# Patient Record
Sex: Male | Born: 1995
Health system: Southern US, Community
[De-identification: ages and names within clinical notes are randomized; demographics above are authoritative.]

## PROBLEM LIST (undated history)

## (undated) DIAGNOSIS — F209 Schizophrenia, unspecified: Secondary | ICD-10-CM

---

## 2019-05-01 ENCOUNTER — Emergency Department (HOSPITAL_COMMUNITY): Payer: Self-pay

## 2019-05-01 ENCOUNTER — Emergency Department (HOSPITAL_COMMUNITY)
Admission: EM | Admit: 2019-05-01 | Discharge: 2019-05-01 | Disposition: A | Discharge status: Home / Self Care | Payer: Self-pay | Attending: Emergency Medicine | Admitting: Emergency Medicine

## 2019-05-01 ENCOUNTER — Encounter (HOSPITAL_COMMUNITY): Payer: Self-pay | Admitting: Emergency Medicine

## 2019-05-01 DIAGNOSIS — R569 Unspecified convulsions: Secondary | ICD-10-CM | POA: Insufficient documentation

## 2019-05-01 DIAGNOSIS — Z79899 Other long term (current) drug therapy: Secondary | ICD-10-CM | POA: Insufficient documentation

## 2019-05-01 LAB — CBC
HCT: 43.3 % (ref 39.0–52.0)
Hemoglobin: 14.7 g/dL (ref 13.0–17.0)
MCH: 29 pg (ref 26.0–34.0)
MCHC: 33.9 g/dL (ref 30.0–36.0)
MCV: 85.4 fL (ref 80.0–100.0)
Platelets: 230 10*3/uL (ref 150–400)
RBC: 5.07 MIL/uL (ref 4.22–5.81)
RDW: 13.4 % (ref 11.5–15.5)
WBC: 13.5 10*3/uL — ABNORMAL HIGH (ref 4.0–10.5)
nRBC: 0 % (ref 0.0–0.2)

## 2019-05-01 LAB — LITHIUM LEVEL: Lithium Lvl: 0.49 mmol/L — ABNORMAL LOW (ref 0.60–1.20)

## 2019-05-01 LAB — ETHANOL: Alcohol, Ethyl (B): <10 mg/dL (ref <10)

## 2019-05-01 LAB — CBG MONITORING, ED: Glucose-Capillary: 94 mg/dL (ref 70–99)

## 2019-05-01 LAB — HEPATIC FUNCTION PANEL
ALT: 31 U/L (ref 0–44)
AST: 34 U/L (ref 15–41)
Albumin: 4.5 g/dL (ref 3.5–5.0)
Alkaline Phosphatase: 69 U/L (ref 38–126)
Bilirubin, Direct: 0.1 mg/dL (ref 0.0–0.2)
Indirect Bilirubin: 0.8 mg/dL (ref 0.3–0.9)
Total Bilirubin: 0.9 mg/dL (ref 0.3–1.2)
Total Protein: 8.1 g/dL (ref 6.5–8.1)

## 2019-05-01 LAB — BASIC METABOLIC PANEL
Anion gap: 13 (ref 5–15)
BUN: 10 mg/dL (ref 6–20)
CO2: 22 mmol/L (ref 22–32)
Calcium: 9.6 mg/dL (ref 8.9–10.3)
Chloride: 103 mmol/L (ref 98–111)
Creatinine, Ser: 1.15 mg/dL (ref 0.61–1.24)
GFR calc Af Amer: >60 mL/min (ref >60)
GFR calc non Af Amer: >60 mL/min (ref >60)
Glucose, Bld: 110 mg/dL — ABNORMAL HIGH (ref 70–99)
Potassium: 3.9 mmol/L (ref 3.5–5.1)
Sodium: 138 mmol/L (ref 135–145)

## 2019-05-01 LAB — SALICYLATE LEVEL: Salicylate Lvl: <7 mg/dL (ref 2.8–30.0)

## 2019-05-01 LAB — RAPID URINE DRUG SCREEN, HOSP PERFORMED
Amphetamines: NOT DETECTED
Barbiturates: NOT DETECTED
Benzodiazepines: NOT DETECTED
Cocaine: NOT DETECTED
Opiates: NOT DETECTED
Tetrahydrocannabinol: NOT DETECTED

## 2019-05-01 LAB — ACETAMINOPHEN LEVEL: Acetaminophen (Tylenol), Serum: <10 ug/mL — ABNORMAL LOW (ref 10–30)

## 2019-05-01 MED ORDER — SODIUM CHLORIDE 0.9 % IV BOLUS
1000.0000 mL | Freq: Once | INTRAVENOUS | Status: AC
  Administered 2019-05-01: 1000 mL via INTRAVENOUS

## 2019-05-01 NOTE — ED Provider Notes (Signed)
[facility] Provider Note   CSN: [id number] Arrival date & time: 05/01/19  1524     History   Chief Complaint Chief Complaint  Patient presents with   Seizures    HPI Stephen Davis is a 23 y.o. male with past medical history of bipolar disorder, denting to the emergency department via EMS with seizure.  Patient states he was at a friend's house when this occurred.  Per EMS, on arrival he appeared to be postictal, with tongue biting and possible urinary incontinence.  They were unsure if he had urinated on himself or had spilled beer that he was holding at the time.  He endorses recent alcohol use, either last night or this morning, however history is somewhat limited due to patient's confusion.  He states he drinks alcohol diet daily or every other day.  He thinks he may have had about 5 beers.  He states he was recently newly prescribed lithium about a week ago and has been taking as prescribed.  He also takes hydroxyzine at night for sleep.  No previous seizure history.  No history of alcohol withdrawal seizure. EMS personnel reports concern for overdose, however by calculating amount of tabs of lithium and hydroxyzine remaining in the bottle comparing to date of film and prescribed dose, patient has the appropriate amount of tablets missing for proper dosing.     The history is provided by the patient and the EMS personnel.    History reviewed. No pertinent past medical history.  There are no active problems to display for this patient.   History reviewed. No pertinent surgical history.      Home Medications    Prior to Admission medications   Medication Sig Start Date End Date Taking? Authorizing Provider  hydrOXYzine (VISTARIL) 25 MG capsule Take 25 mg by mouth every 4 (four) hours as needed for anxiety.    Yes [provider]  lithium carbonate (LITHOBID) 300 MG CR tablet Take 300 mg by mouth 2 (two) times daily.   Yes  [provider]    Family History No family history on file.  Social History Social History   Tobacco Use   Smoking status: Not on file  Substance Use Topics   Alcohol use: Not on file   Drug use: Not on file     Allergies   Patient has no known allergies.   Review of Systems Review of Systems  Unable to perform ROS: Mental status change  Constitutional: Negative for fever.  Neurological: Positive for seizures. Negative for headaches.  Psychiatric/Behavioral: Positive for confusion.     Physical Exam Updated Vital Signs BP 120/77    Pulse (!) 107    Temp 98.2 F (36.8 C) (Oral)    Resp 20    SpO2 99%   Physical Exam Vitals signs and nursing note reviewed.  Constitutional:      General: He is not in acute distress.    Appearance: He is well-developed.  HENT:     Head: Normocephalic and atraumatic.     Mouth/Throat:     Comments: Small laceration to the lateral aspect of right tongue.  Not actively bleeding. Eyes:     Conjunctiva/sclera: Conjunctivae normal.  Cardiovascular:     Rate and Rhythm: Regular rhythm. Tachycardia present.  Pulmonary:     Effort: Pulmonary effort is normal. No respiratory distress.     Breath sounds: Normal breath sounds.  Abdominal:     General: Bowel sounds are normal.  Palpations: Abdomen is soft.     Tenderness: There is no abdominal tenderness. There is no guarding or rebound.  Skin:    General: Skin is warm.  Neurological:     Mental Status: He is alert.     Comments: Patient is alert though appears drowsy and is confused with questioning regarding HPI and ROS.  Speech is slurred. PERRL, EOM normal.  Cranial nerves grossly intact.  5/5 grip strength bilateral upper extremities.  Psychiatric:        Behavior: Behavior normal.      ED Treatments / Results  Labs (all labs ordered are listed, but only abnormal results are displayed) Labs Reviewed  BASIC METABOLIC PANEL - Abnormal; Notable for the following  components:      Result Value   Glucose, Bld 110 (*)    All other components within normal limits  LITHIUM LEVEL - Abnormal; Notable for the following components:   Lithium Lvl 0.49 (*)    All other components within normal limits  ACETAMINOPHEN LEVEL - Abnormal; Notable for the following components:   Acetaminophen (Tylenol), Serum <10 (*)    All other components within normal limits  CBC - Abnormal; Notable for the following components:   WBC 13.5 (*)    All other components within normal limits  HEPATIC FUNCTION PANEL  RAPID URINE DRUG SCREEN, HOSP PERFORMED  SALICYLATE LEVEL  ETHANOL  CBC  CBG MONITORING, ED  CBG MONITORING, ED    EKG EKG Interpretation  Date/Time:  Wednesday May 01 2019 15:28:39 EST Ventricular Rate:  104 PR Interval:    QRS Duration: 96 QT Interval:  363 QTC Calculation: 478 R Axis:   66 Text Interpretation: Sinus tachycardia Borderline prolonged QT interval Confirmed by Virgina Norfolk [phone]) on 05/01/2019 4:03:00 PM   Radiology Ct Head Wo Contrast  Result Date: 05/01/2019 CLINICAL DATA:  Seizure, new, nontraumatic, 18-40 years. EXAM: CT HEAD WITHOUT CONTRAST TECHNIQUE: Contiguous axial images were obtained from the base of the skull through the vertex without intravenous contrast. COMPARISON:  No pertinent prior studies available for comparison. FINDINGS: Brain: No evidence of acute intracranial hemorrhage. No demarcated cortical infarction. No evidence of intracranial mass. No midline shift or extra-axial fluid collection. Cerebral volume is normal for age. Vascular: No hyperdense vessel. Skull: Normal. Negative for fracture or focal lesion. Sinuses/Orbits: Visualized orbits demonstrate no acute abnormality. Small mucous retention cysts within the bilateral maxillary sinuses. IMPRESSION: No CT evidence of acute intracranial abnormality. Given provided history of new seizure, consider brain MRI with seizure protocol for further evaluation.  Electronically Signed   By: Jackey Loge DO   On: 05/01/2019 17:43    Procedures Procedures (including critical care time)  Medications Ordered in ED Medications  sodium chloride 0.9 % bolus 1,000 mL (1,000 mLs Intravenous New Bag/Given 05/01/19 1824)     Initial Impression / Assessment and Plan / ED Course  I have reviewed the triage vital signs and the nursing notes.  Pertinent labs & imaging results that were available during my care of the patient were reviewed by me and considered in my medical decision making (see chart for details).  Clinical Course as of Apr 30 1945  Wed May 01, 2019  1540 On reevaluation, patient appears to be at his baseline.  He states he is feeling much better.  History is more clear at this time.  He states he had  two 20 ounce beers earlier this morning.  He endorses that this is the first prescription of  lithium he has received.  He was taking as prescribed.  His mother is at bedside now, reports she had seizures from preeclampsia, however never had recurrence.  No other family seizure disorder known.  His lab work is reassuring, discussed this with patient.  Pending CBC is initial specimen clotted.  CT head is negative.  IV fluids finished.  Patient ambulatory in the ED without difficulty.  Discussed outpatient seizure precautions and importance for close outpatient neurology follow-up.  Patient is agreeable to plan and without complaint at this time.   [JR]    Clinical Course User Index [JR] Fahim Kats, [place] N, PA-C       Patient presenting with likely seizure that occurred prior to arrival.  Patient was postictal on scene with tongue biting had occurred.  Patient recently started on lithium at 1.5 weeks ago for bipolar disorder.  This is his first prescription of this medication, and he has been taking as prescribed.  He also endorses alcohol use this morning.  Suspect seizure episode was due to lowered seizure threshold due to combination of medications  and alcohol.  Patient is somewhat confused on initial evaluation, however after observation patient returned to baseline and is ambulatory without difficulty.  He is in no distress.  Lab work is very reassuring.  Head CT is negative.  No family history of seizure disorder, with the exception of his mother having seizure due to preeclampsia without recurrence since.  Patient discussed with and evaluated by Dr. Ronnald Nian.  Will discharge at this time with ambulatory referral to neurology for further outpatient work-up.  He is instructing of driving restrictions and strict return precautions should seizure recur.  Pt is well-appearing, agreeable to plan, and safe for discharge.  Discussed results, findings, treatment and follow up. Patient advised of return precautions. Patient verbalized understanding and agreed with plan.   Final Clinical Impressions(s) / ED Diagnoses   Final diagnoses:  Seizure [facility])    ED Discharge Orders         Ordered    Ambulatory referral to Neurology    Comments: An appointment is requested in approximately: 1 week   05/01/19 1944           Teodora Baumgarten, [place] N, PA-C 05/01/19 [place], [place], DO 05/02/19 [phone]

## 2019-05-01 NOTE — ED Triage Notes (Signed)
Pt arrived via [facility] from home where family called EMS to report pt seizure. EMS found pt postictal on scene but was alert and oriented upon ED arrival. Family reported to EMS that pt had potentially overdosed on lithium and hydroxyzine. Medication counted by EMS and found to be missing 17 lithium and 16 hydroxyzine pills, based on prescription dates. Pt alert and oriented but slow to respond to some questions. Tongue trauma noted, no other injuries noted at this time.

## 2019-05-01 NOTE — Discharge Instructions (Signed)
Please stop taking the lithium and discuss with your prescribing provider. Do not drive or operate any heavy machinery until you have been cleared by the neurologist to do so.  Generally it is at least for 6 months. The neurology office should give you a call to schedule an appointment, however if you not hear from them in the next couple of days, please reach out for an appointment.   If you have recurrent episode of seizure, please return to the emergency department.

## 2019-05-01 NOTE — ED Provider Notes (Signed)
Medical screening examination/treatment/procedure(s) were conducted as a shared visit with non-physician practitioner(s) and myself.  I personally evaluated the patient during the encounter. Briefly, the patient is a 23 y.o. male presents the ED with seizure.  Patient with normal vitals.  No fever.  Patient recently started on lithium and Vistaril.  States that he drinks alcohol daily.  Last drink was maybe this morning last night.  Took a Xanax this morning as well.  Denies any suicidal homicidal ideation.  Had a seizure like episode prior to arrival.  EKG shows sinus tachycardia.  No ischemic changes.  No concern for arrhythmic changes.  QTC is 478.  Patient has cut to his tongue, postictal.  Likely did have a seizure.  Otherwise is neurologically intact.  Blood sugar is normal.  Counting out his medications it looks like he is appropriately taken his lithium.  There are 16 to 17 pills missing which she filled about 9 days ago.  Likely not related to lithium but will check a level.  Likely seizure due to polysubstance abuse and these new medications.  We will get screening labs, ethanol level.  CT head.  Will reevaluate.  Does not appear to be endorsing any suicidal ideation.  Does not appear that he intentionally took any of his medications inappropriately.  Overall it appears that he is appropriately following his medicine regimen but mixing it with alcohol and drugs is likely not ideal.  Lab work overall unremarkable.  Ethanol level normal.  Tylenol and salicylate level normal.  Lithium level below therapeutic level.  CT head unremarkable.  No significant anemia, electrolyte abnormality, kidney injury.  Will have patient follow-up with neurology for EEG and MRI.  Understands no driving or operating heavy machinery until he is cleared by neurology.   This chart was dictated using voice recognition software.  Despite best efforts to proofread,  errors can occur which can change the documentation meaning.      EKG Interpretation  Date/Time:  Wednesday May 01 2019 15:28:39 EST Ventricular Rate:  104 PR Interval:    QRS Duration: 96 QT Interval:  363 QTC Calculation: 478 R Axis:   66 Text Interpretation: Sinus tachycardia Borderline prolonged QT interval Confirmed by Lennice Sites [phone]) on 05/01/2019 4:03:00 PM           Lennice Sites, DO 05/01/19 1941

## 2020-06-21 ENCOUNTER — Inpatient Hospital Stay (HOSPITAL_COMMUNITY)
Admission: EM | Admit: 2020-06-21 | Discharge: 2020-07-03 | DRG: 917 | Disposition: A | Discharge status: Other Acute Inpatient Hospital | Payer: Medicaid Other | Attending: Internal Medicine | Admitting: Internal Medicine

## 2020-06-21 ENCOUNTER — Encounter (HOSPITAL_COMMUNITY): Payer: Self-pay | Admitting: Emergency Medicine

## 2020-06-21 ENCOUNTER — Other Ambulatory Visit: Payer: Self-pay

## 2020-06-21 DIAGNOSIS — F319 Bipolar disorder, unspecified: Secondary | ICD-10-CM | POA: Diagnosis present

## 2020-06-21 DIAGNOSIS — E876 Hypokalemia: Secondary | ICD-10-CM | POA: Diagnosis present

## 2020-06-21 DIAGNOSIS — T443X1A Poisoning by other parasympatholytics [anticholinergics and antimuscarinics] and spasmolytics, accidental (unintentional), initial encounter: Secondary | ICD-10-CM

## 2020-06-21 DIAGNOSIS — Z818 Family history of other mental and behavioral disorders: Secondary | ICD-10-CM

## 2020-06-21 DIAGNOSIS — F209 Schizophrenia, unspecified: Secondary | ICD-10-CM | POA: Diagnosis present

## 2020-06-21 DIAGNOSIS — G9341 Metabolic encephalopathy: Secondary | ICD-10-CM | POA: Diagnosis present

## 2020-06-21 DIAGNOSIS — T1491XA Suicide attempt, initial encounter: Secondary | ICD-10-CM

## 2020-06-21 DIAGNOSIS — Z781 Physical restraint status: Secondary | ICD-10-CM

## 2020-06-21 DIAGNOSIS — H5704 Mydriasis: Secondary | ICD-10-CM | POA: Diagnosis present

## 2020-06-21 DIAGNOSIS — T50902A Poisoning by unspecified drugs, medicaments and biological substances, intentional self-harm, initial encounter: Secondary | ICD-10-CM

## 2020-06-21 DIAGNOSIS — Y901 Blood alcohol level of 20-39 mg/100 ml: Secondary | ICD-10-CM | POA: Diagnosis present

## 2020-06-21 DIAGNOSIS — T50901A Poisoning by unspecified drugs, medicaments and biological substances, accidental (unintentional), initial encounter: Secondary | ICD-10-CM | POA: Diagnosis present

## 2020-06-21 DIAGNOSIS — T450X2A Poisoning by antiallergic and antiemetic drugs, intentional self-harm, initial encounter: Principal | ICD-10-CM | POA: Diagnosis present

## 2020-06-21 DIAGNOSIS — G928 Other toxic encephalopathy: Secondary | ICD-10-CM | POA: Diagnosis present

## 2020-06-21 DIAGNOSIS — Z20822 Contact with and (suspected) exposure to covid-19: Secondary | ICD-10-CM | POA: Diagnosis present

## 2020-06-21 DIAGNOSIS — Z9151 Personal history of suicidal behavior: Secondary | ICD-10-CM

## 2020-06-21 DIAGNOSIS — Z9114 Patient's other noncompliance with medication regimen: Secondary | ICD-10-CM

## 2020-06-21 DIAGNOSIS — F17210 Nicotine dependence, cigarettes, uncomplicated: Secondary | ICD-10-CM | POA: Diagnosis present

## 2020-06-21 HISTORY — DX: Schizophrenia, unspecified: F20.9

## 2020-06-21 LAB — CBG MONITORING, ED: Glucose-Capillary: 95 mg/dL (ref 70–99)

## 2020-06-21 MED ORDER — LACTATED RINGERS IV BOLUS
1000.0000 mL | Freq: Once | INTRAVENOUS | Status: AC
  Administered 2020-06-21: 1000 mL via INTRAVENOUS

## 2020-06-21 NOTE — ED Provider Notes (Incomplete)
I provided a substantive portion of the care of this patient.  I personally performed the entirety of the history, exam and medical decision making for this encounter.  Reported large benadryl dose. On exam has roving eye movements, dilated, tachycardic. Initially was decreased LOC but has improved since being here. Will not currently require intubation. Plan for fluid resuscitation and observation for medical clearance.    EKG Interpretation  Date/Time:  Sunday June 21 2020 23:15:52 EST Ventricular Rate:  143 PR Interval:    QRS Duration: 106 QT Interval:  287 QTC Calculation: 443 R Axis:   62 Text Interpretation: Sinus tachycardia Nonspecific repol abnormality, diffuse leads repol changes likely relatedc to rate, qt unremarkable, qrs appropriate Confirmed by Marily Memos [phone]) on 06/21/2020 11:48:38 PM       CRITICAL CARE Performed by: Marily Memos Total critical care time: 35 minutes Critical care time was exclusive of separately billable procedures and treating other patients. Critical care was necessary to treat or prevent imminent or life-threatening deterioration. Critical care was time spent personally by me on the following activities: development of treatment plan with patient and/or surrogate as well as nursing, discussions with consultants, evaluation of patient's response to treatment, examination of patient, obtaining history from patient or surrogate, ordering and performing treatments and interventions, ordering and review of laboratory studies, ordering and review of radiographic studies, pulse oximetry and re-evaluation of patient's condition.

## 2020-06-21 NOTE — ED Notes (Signed)
Danielle at [employer] recommends: Observe for a minimum of 6 hrs., longer if symptomatic. Activated charcoal if tolerated (1g/kg). Fluids for tachycardia. Benzos for agitation or seizure. Repeat EKG in 4 hours. Bicarb if QRS > 120. If QTC >500, optimize K+ and mag. 4 hour tylenol level and baseline lithium level recommended. Monitor urination.

## 2020-06-21 NOTE — ED Provider Notes (Signed)
[facility]-EMERGENCY DEPT Provider Note   CSN: [id number] Arrival date & time: 06/21/20  2314     History Chief Complaint  Patient presents with   Drug Overdose    benadryl    Stephen Davis is a 25 y.o. male.  Patient brought in via EMS from a motel with chief complaint of Benadryl overdose.  Reportedly patient crushed up three quarters of a bottle of Benadryl tablets, and drink the powder with a beer.  No reported history of SI or HI.  EMS reports that he has become more sleepy since they picked him up.  Level 5 caveat applies secondary to altered mental status.  The history is provided by the patient. No language interpreter was used.       History reviewed. No pertinent past medical history.  There are no problems to display for this patient.   History reviewed. No pertinent surgical history.     History reviewed. No pertinent family history.  Social History   Tobacco Use   Smoking status: Unknown If Ever Smoked    Home Medications Prior to Admission medications   Medication Sig Start Date End Date Taking? Authorizing Provider  hydrOXYzine (VISTARIL) 25 MG capsule Take 25 mg by mouth every 4 (four) hours as needed for anxiety.     [provider]  lithium carbonate (LITHOBID) 300 MG CR tablet Take 300 mg by mouth 2 (two) times daily.    [provider]    Allergies    Patient has no known allergies.  Review of Systems   Review of Systems  All other systems reviewed and are negative.   Physical Exam Updated Vital Signs BP (!) 173/119    Pulse (!) 140    Temp 98.8 F (37.1 C) (Oral)    Resp 17    SpO2 100%   Physical Exam Vitals and nursing note reviewed.  Constitutional:      Appearance: He is well-developed and well-nourished.     Comments: awake  HENT:     Head: Normocephalic and atraumatic.  Eyes:     Conjunctiva/sclera: Conjunctivae normal.  Cardiovascular:     Rate and Rhythm: Regular rhythm.  Tachycardia present.     Heart sounds: No murmur heard.   Pulmonary:     Effort: Pulmonary effort is normal. No respiratory distress.     Breath sounds: Normal breath sounds.  Abdominal:     Palpations: Abdomen is soft.     Tenderness: There is no abdominal tenderness.  Musculoskeletal:        General: No edema.     Cervical back: Neck supple.  Skin:    General: Skin is warm and dry.  Neurological:     Comments: GCS 11 [id number]  Psychiatric:        Mood and Affect: Mood and affect normal.     Comments: Unable to assess     ED Results / Procedures / Treatments   Labs (all labs ordered are listed, but only abnormal results are displayed) Labs Reviewed  SARS CORONAVIRUS 2 (TAT 6-24 HRS)  COMPREHENSIVE METABOLIC PANEL  SALICYLATE LEVEL  ACETAMINOPHEN LEVEL  ETHANOL  RAPID URINE DRUG SCREEN, HOSP PERFORMED  CBC WITH DIFFERENTIAL/PLATELET  LITHIUM LEVEL  CBG MONITORING, ED    EKG None  Radiology No results found.  Procedures .Critical Care Performed by: Roxy Horseman, PA-C Authorized by: Roxy Horseman, PA-C   Critical care provider statement:    Critical care time (minutes):  50   Critical  care was necessary to treat or prevent imminent or life-threatening deterioration of the following conditions:  Toxidrome   Critical care was time spent personally by me on the following activities:  Discussions with consultants, evaluation of patient's response to treatment, examination of patient, ordering and performing treatments and interventions, ordering and review of laboratory studies, ordering and review of radiographic studies, pulse oximetry, re-evaluation of patient's condition, obtaining history from patient or surrogate and review of old charts     Medications Ordered in ED Medications  lactated ringers bolus 1,000 mL (has no administration in time range)    ED Course  I have reviewed the triage vital signs and the nursing notes.  Pertinent labs &  imaging results that were available during my care of the patient were reviewed by me and considered in my medical decision making (see chart for details).    MDM Rules/Calculators/A&P                          Patient here with suicide attempt.  He took approximately 45 tablets of Benadryl along with some alcohol.  Per his girlfriend, this was in an attempt to kill himself.  He is under IVC.  He is awake, and can follow commands, but he is nonverbal at this point.  GCS 11.  Will check labs check for coingestants.  Poison control contacted.  Danielle at [employer] recommends: Observe for a minimum of 6 hrs., longer if symptomatic. Activated charcoal if tolerated (1g/kg). Fluids for tachycardia. Benzos for agitation or seizure. Repeat EKG in 4 hours. Bicarb if QRS > 120. If QTC >500, optimize K+ and mag. 4 hour tylenol level and baseline lithium level recommended. Monitor urination.  Patient becoming agitated, will give a dose of Ativan.  4:59 AM Patient reassessed, still confused, GCS 11.  Tachycardia has improved.  We will continue with fluids.  As needed Ativan ordered for agitation.  Case discussed with Dr. Toniann Fail, who is appreciated for admitting.  Final Clinical Impression(s) / ED Diagnoses Final diagnoses:  Suicide attempt [facility])  Intentional drug overdose, initial encounter [facility])    Rx / DC Orders ED Discharge Orders    None       Roxy Horseman, PA-C 06/22/20 0502    Mesner, Barbara Cower, MD 06/22/20 [phone]

## 2020-06-21 NOTE — ED Triage Notes (Signed)
Pt arrived via EMS from a motel. Pt crushed up a bottle of benadryl and swallowed 3/4 of the powder with a beer at 2245 per pt's girlfriend. Pt was trying to hurt himself. Pt has hx of SI, no hx of HI. Pt has become more lethargic since EMS arrived. Pt is tachy at 140s.

## 2020-06-22 ENCOUNTER — Encounter (HOSPITAL_COMMUNITY): Payer: Self-pay | Admitting: Internal Medicine

## 2020-06-22 DIAGNOSIS — G9341 Metabolic encephalopathy: Secondary | ICD-10-CM | POA: Diagnosis present

## 2020-06-22 DIAGNOSIS — T50901A Poisoning by unspecified drugs, medicaments and biological substances, accidental (unintentional), initial encounter: Secondary | ICD-10-CM | POA: Diagnosis present

## 2020-06-22 DIAGNOSIS — F209 Schizophrenia, unspecified: Secondary | ICD-10-CM | POA: Diagnosis present

## 2020-06-22 DIAGNOSIS — T443X1A Poisoning by other parasympatholytics [anticholinergics and antimuscarinics] and spasmolytics, accidental (unintentional), initial encounter: Secondary | ICD-10-CM

## 2020-06-22 DIAGNOSIS — T1491XA Suicide attempt, initial encounter: Secondary | ICD-10-CM | POA: Diagnosis present

## 2020-06-22 DIAGNOSIS — E876 Hypokalemia: Secondary | ICD-10-CM | POA: Diagnosis present

## 2020-06-22 LAB — CBC WITH DIFFERENTIAL/PLATELET
Abs Immature Granulocytes: 0.02 10*3/uL (ref 0.00–0.07)
Basophils Absolute: 0 10*3/uL (ref 0.0–0.1)
Basophils Relative: 0 %
Eosinophils Absolute: 0.2 10*3/uL (ref 0.0–0.5)
Eosinophils Relative: 3 %
HCT: 41.1 % (ref 39.0–52.0)
Hemoglobin: 14 g/dL (ref 13.0–17.0)
Immature Granulocytes: 0 %
Lymphocytes Relative: 34 %
Lymphs Abs: 2.1 10*3/uL (ref 0.7–4.0)
MCH: 28.7 pg (ref 26.0–34.0)
MCHC: 34.1 g/dL (ref 30.0–36.0)
MCV: 84.2 fL (ref 80.0–100.0)
Monocytes Absolute: 0.4 10*3/uL (ref 0.1–1.0)
Monocytes Relative: 7 %
Neutro Abs: 3.3 10*3/uL (ref 1.7–7.7)
Neutrophils Relative %: 56 %
Platelets: 248 10*3/uL (ref 150–400)
RBC: 4.88 MIL/uL (ref 4.22–5.81)
RDW: 13 % (ref 11.5–15.5)
WBC: 6 10*3/uL (ref 4.0–10.5)
nRBC: 0 % (ref 0.0–0.2)

## 2020-06-22 LAB — COMPREHENSIVE METABOLIC PANEL
ALT: 14 U/L (ref 0–44)
AST: 18 U/L (ref 15–41)
Albumin: 4.9 g/dL (ref 3.5–5.0)
Alkaline Phosphatase: 58 U/L (ref 38–126)
Anion gap: 11 (ref 5–15)
BUN: 5 mg/dL — ABNORMAL LOW (ref 6–20)
CO2: 25 mmol/L (ref 22–32)
Calcium: 9.4 mg/dL (ref 8.9–10.3)
Chloride: 102 mmol/L (ref 98–111)
Creatinine, Ser: 0.9 mg/dL (ref 0.61–1.24)
GFR, Estimated: >60 mL/min (ref >60)
Glucose, Bld: 114 mg/dL — ABNORMAL HIGH (ref 70–99)
Potassium: 2.9 mmol/L — ABNORMAL LOW (ref 3.5–5.1)
Sodium: 138 mmol/L (ref 135–145)
Total Bilirubin: 0.7 mg/dL (ref 0.3–1.2)
Total Protein: 8.2 g/dL — ABNORMAL HIGH (ref 6.5–8.1)

## 2020-06-22 LAB — HIV ANTIBODY (ROUTINE TESTING W REFLEX): HIV Screen 4th Generation wRfx: NONREACTIVE

## 2020-06-22 LAB — ACETAMINOPHEN LEVEL
Acetaminophen (Tylenol), Serum: <10 ug/mL — ABNORMAL LOW (ref 10–30)
Acetaminophen (Tylenol), Serum: <10 ug/mL — ABNORMAL LOW (ref 10–30)

## 2020-06-22 LAB — SARS CORONAVIRUS 2 (TAT 6-24 HRS): SARS Coronavirus 2: NEGATIVE

## 2020-06-22 LAB — RAPID URINE DRUG SCREEN, HOSP PERFORMED
Amphetamines: NOT DETECTED
Barbiturates: NOT DETECTED
Benzodiazepines: NOT DETECTED
Cocaine: NOT DETECTED
Opiates: NOT DETECTED
Tetrahydrocannabinol: NOT DETECTED

## 2020-06-22 LAB — MAGNESIUM
Magnesium: 2 mg/dL (ref 1.7–2.4)
Magnesium: 2 mg/dL (ref 1.7–2.4)

## 2020-06-22 LAB — LITHIUM LEVEL: Lithium Lvl: 0.01 mmol/L — ABNORMAL LOW (ref 0.60–1.20)

## 2020-06-22 LAB — ETHANOL: Alcohol, Ethyl (B): 23 mg/dL — ABNORMAL HIGH (ref <10)

## 2020-06-22 LAB — SALICYLATE LEVEL: Salicylate Lvl: <7 mg/dL — ABNORMAL LOW (ref 7.0–30.0)

## 2020-06-22 LAB — CK: Total CK: 181 U/L (ref 49–397)

## 2020-06-22 LAB — POTASSIUM: Potassium: 4 mmol/L (ref 3.5–5.1)

## 2020-06-22 MED ORDER — LORAZEPAM 2 MG/ML IJ SOLN: 1.0000 mg | Freq: Once | INTRAMUSCULAR | Status: DC

## 2020-06-22 MED ORDER — ONDANSETRON HCL 4 MG PO TABS: 4.0000 mg | ORAL_TABLET | Freq: Four times a day (QID) | ORAL | Status: DC | PRN

## 2020-06-22 MED ORDER — ACETAMINOPHEN 650 MG RE SUPP: 650.0000 mg | Freq: Four times a day (QID) | RECTAL | Status: DC | PRN

## 2020-06-22 MED ORDER — SODIUM CHLORIDE 0.9 % IV BOLUS: 1000.0000 mL | Freq: Once | INTRAVENOUS | Status: DC

## 2020-06-22 MED ORDER — POTASSIUM CHLORIDE 2 MEQ/ML IV SOLN: INTRAVENOUS | Status: DC

## 2020-06-22 MED ORDER — ACETAMINOPHEN 325 MG PO TABS: 650.0000 mg | ORAL_TABLET | Freq: Once | ORAL | Status: DC

## 2020-06-22 MED ORDER — ACETAMINOPHEN 325 MG PO TABS: 650.0000 mg | ORAL_TABLET | Freq: Four times a day (QID) | ORAL | Status: DC | PRN

## 2020-06-22 MED ORDER — RISPERIDONE 1 MG PO TABS
1.0000 mg | ORAL_TABLET | Freq: Every day | ORAL | Status: DC
  Administered 2020-06-22 – 2020-07-01 (×10): 1 mg via ORAL
  Filled 2020-06-22 (×11): qty 1

## 2020-06-22 MED ORDER — SODIUM CHLORIDE 0.9 % IV BOLUS
1000.0000 mL | Freq: Once | INTRAVENOUS | Status: AC
  Administered 2020-06-22: 1000 mL via INTRAVENOUS

## 2020-06-22 MED ORDER — ONDANSETRON HCL 4 MG/2ML IJ SOLN: 4.0000 mg | Freq: Four times a day (QID) | INTRAMUSCULAR | Status: DC | PRN

## 2020-06-22 MED ORDER — LORAZEPAM 2 MG/ML IJ SOLN
2.0000 mg | Freq: Two times a day (BID) | INTRAMUSCULAR | Status: DC | PRN
  Filled 2020-06-22: qty 1

## 2020-06-22 MED ORDER — LORAZEPAM 2 MG/ML IJ SOLN
2.0000 mg | Freq: Once | INTRAMUSCULAR | Status: AC | PRN
  Administered 2020-06-22: 2 mg via INTRAVENOUS
  Filled 2020-06-22: qty 1

## 2020-06-22 MED ORDER — ENOXAPARIN SODIUM 40 MG/0.4ML ~~LOC~~ SOLN
40.0000 mg | SUBCUTANEOUS | Status: DC
  Administered 2020-06-22 – 2020-07-02 (×10): 40 mg via SUBCUTANEOUS
  Filled 2020-06-22 (×11): qty 0.4

## 2020-06-22 MED ORDER — POTASSIUM CHLORIDE 10 MEQ/100ML IV SOLN
10.0000 meq | INTRAVENOUS | Status: AC
Start: 2020-06-22 — End: 2020-06-22
  Administered 2020-06-22 (×3): 10 meq via INTRAVENOUS
  Filled 2020-06-22 (×3): qty 100

## 2020-06-22 MED ORDER — POTASSIUM CHLORIDE IN NACL 20-0.9 MEQ/L-% IV SOLN
INTRAVENOUS | Status: DC
  Filled 2020-06-22 (×4): qty 1000

## 2020-06-22 MED ORDER — LORAZEPAM 2 MG/ML IJ SOLN
2.0000 mg | Freq: Once | INTRAMUSCULAR | Status: AC
  Administered 2020-06-22: 2 mg via INTRAVENOUS
  Filled 2020-06-22: qty 1

## 2020-06-22 NOTE — H&P (Addendum)
History and Physical  Patient Name: Stephen Davis     PNT:[id number]    DOB: August 14, 1995    DOA: 06/21/2020 PCP: Patient, No Pcp Per  Patient coming from: Motel  Chief Complaint: Overdose      HPI: Stephen Davis is a 25 y.o. M with hx schizophrenia or schizoaffective disorder who presents with overdose of Benadryl.  Caveat that the patient is encephalopathic, all history collected from chart.  Attempted to call number listed in chart as his mother/only contact, this is disconnected.  Evidently the patient was in a motel with his girlfriend, crushed up a bottle of Benadryl and ingested three quarters of it with a beer.  He started to get altered and so girlfriend called EMS.  On arrival, the patient was tachycardic to 140, but interactive.  He endorsed ingestion of Benadryl, denied any intent for self-harm.  While being observed in the ER, the patient became progressively more altered, agitated.  Blood and urine chemistries were notable for potassium 2.9, normal renal function, normal hemogram.  Negative acetaminophen and salicylate levels. Therapeutic lithium level.  Slightly detectable alcohol level.  Negative UDS.  EKG showed sinus tachycardia, and repeat showed improved sinus tachycardia without QT changes.  ST segments showed early repolarization, no ischemic findings.  Patient given Ativan and placed in restraints due to aggression and confusion.          ROS: Review of Systems  Unable to perform ROS: Mental status change  Patient is densely encephalopathic, does not respond to questions.      Past Medical History:  Diagnosis Date  . Schizophrenia vs depression with suicide attempt     History reviewed. No pertinent surgical history. No prior surgeries.  Social History: Unknown, unable to obtain due to patient mentation.  Came from a motel.  ADDENDUM: Lives with girlfriend in student housing.  Was in school to get CDL, mother not sure now.   No Known  Allergies  Family history: Depression in mother, bipolar probably, in father.    Prior to Admission medications   Medication Sig Start Date End Date Taking? Authorizing Provider  hydrOXYzine (VISTARIL) 25 MG capsule Take 25 mg by mouth every 4 (four) hours as needed for anxiety.    Yes [provider]       Physical Exam: BP (!) 147/117   Pulse 82   Temp 98.8 F (37.1 C) (Oral)   Resp (!) 21   SpO2 100%  General appearance: Thin adult male, eyes open, briefly makes eye contact, but does not make any spontaneous verbalizations. No obviously purposeful movements.   Eyes: Anicteric, conjunctiva pink, lids and lashes normal.  Pupils dilated, equal, reactive sluggishly.    ENT: No nasal deformity, discharge, epistaxis.  Mucous membranes dry, patient does not cooperate with exam, but I see no oral lesions.   Skin: Warm and dry.  No jaundice.  No suspicious rashes or lesions. Cardiac: RRR, nl S1-S2, no murmurs appreciated.  Capillary refill is brisk.  JVP normal.  No LE edema.  Radial and DP pulses 2+ and symmetric. Respiratory: Normal respiratory rate and rhythm.  CTAB without rales or wheezes. Abdomen: Abdomen soft.  No grimace to palpation, no guarding.  No ascites, distension, hepatosplenomegaly.   MSK: No deformities or effusions of the large joints of the upper or lower extremities bilaterally.  No cyanosis or clubbing. Neuro: Pupils equal and reactive.  He occasionally makes eye contact, and reaches with both arms symmetrically and with normal strength in all 4  limbs in restraints, but does not follow commands at all, and makes no spontaneous verbalizations.  Psych: Eyes staring, seems to be mouthing words, densely encephalopathic.     Labs on Admission:  I have personally reviewed following labs and imaging studies: CBC: Recent Labs  Lab 06/21/20 2335  WBC 6.0  NEUTROABS 3.3  HGB 14.0  HCT 41.1  MCV 84.2  PLT 248   Basic Metabolic Panel: Recent Labs  Lab  06/21/20 2320 06/21/20 2335  NA  --  138  K  --  2.9*  CL  --  102  CO2  --  25  GLUCOSE  --  114*  BUN  --  5*  CREATININE  --  0.90  CALCIUM  --  9.4  MG 2.0  --    GFR: CrCl cannot be calculated (Unknown ideal weight.).  Liver Function Tests: Recent Labs  Lab 06/21/20 2335  AST 18  ALT 14  ALKPHOS 58  BILITOT 0.7  PROT 8.2*  ALBUMIN 4.9    Cardiac Enzymes: Recent Labs  Lab 06/21/20 2335  CKTOTAL 181    CBG: Recent Labs  Lab 06/21/20 2340  GLUCAP 95    Recent Results (from the past 240 hour(s))  SARS CORONAVIRUS 2 (TAT 6-24 HRS) Nasopharyngeal Nasopharyngeal Swab     Status: None   Collection Time: 06/21/20 11:36 PM   Specimen: Nasopharyngeal Swab  Result Value Ref Range Status   SARS Coronavirus 2 NEGATIVE NEGATIVE Final    Comment: (NOTE) SARS-CoV-2 target nucleic acids are NOT DETECTED.  The SARS-CoV-2 RNA is generally detectable in upper and lower respiratory specimens during the acute phase of infection. Negative results do not preclude SARS-CoV-2 infection, do not rule out co-infections with other pathogens, and should not be used as the sole basis for treatment or other patient management decisions. Negative results must be combined with clinical observations, patient history, and epidemiological information. The expected result is Negative.  Fact Sheet for Patients: HairSlick.no  Fact Sheet for Healthcare Providers: quierodirigir.com  This test is not yet approved or cleared by the [place] FDA and  has been authorized for detection and/or diagnosis of SARS-CoV-2 by FDA under an Emergency Use Authorization (EUA). This EUA will remain  in effect (meaning this test can be used) for the duration of the COVID-19 declaration under Se ction 564(b)(1) of the Act, 21 U.S.C. section 360bbb-3(b)(1), unless the authorization is terminated or revoked sooner.  Performed at [facility], [address], [place], [place] [address]             EKG: Independently reviewed.  Initial EKG, rate 143, sinus.  Second EKG shows slowed sinus tachycardia, rate 115, QTc 485, upper limit of normal, but still normal, early repolarization pattern nodes ST depressions or ischemic changes.       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  -IV fluids -Restraints as needed to protect patient from self-harm -Ativan as needed for severe agitation -Continue telemetry   ADDENDUM: Suicide attempt Able to obtain collateral from mother later this morning.  She reports patient has long standing history of depression, has been hospitalized in a psych unit at [facility] "about once a year" for "a few years now" for suicide attempts, although she is somewhat vague on details.  She doesn't know if his diagnosis is depression, bipolar, schizophrenia or  other.  He was most recently on Seroquel, but he stopped this due to cost she thinks, some time ago.  In the last few months, he has been noticeably depressed again.  In the last few weeks, this got worse, and yesterday he called her and told her he "couldn't be here anymore" and wanted to kill himself by taking pills.  -IVC in place -Consult Psychiatry, appreciate cares     Hypokalemia Magnesium replete.  Given potassium already -Continue potassium supplement -Repeat potassium level midday  Schizophrenia  Currently encephalopathic due to Benadryl overdose.  Unable to assess for psychiatric symptoms.       DVT prophylaxis: Lovenox  Code Status: FULL  Family Communication: Attempted call to mother, phone number disconnected  Disposition Plan: Anticipate IV fluids, monitoring.  When he wakes up, will plan for d/c to  home. Consults called: None Admission status: OBS   At the point of initial evaluation, it is my clinical opinion that admission for OBSERVATION is reasonable and necessary because the patient's presenting complaints in the context of their chronic conditions represent sufficient risk of deterioration or significant morbidity to constitute reasonable grounds for close observation in the hospital setting, but that the patient may be medically stable for discharge from the hospital within 24 to 48 hours.    Medical decision making: Patient seen at 7:30 AM on 06/22/2020.  What exists of the patient's chart was reviewed in depth and summarized above.  Clinical condition: hemodynamically stable but still densely encephalopathic.        Earl Lites Kyleeann Cremeans [facility] Please page though AMION or Epic secure chat:  For password, contact charge nurse

## 2020-06-22 NOTE — ED Notes (Signed)
Nurse came out of another pt room to find staff responding to pt attempting to get out of bed despite presence of soft wrist restraints. While staff attempted to clean pt, who had urinated on self, pt grabbed a member of staff and security was called. Pt was put in posey belt and ankle restraints and given PRN Ativan 2mg . Staff continually attempted to orient pt to time and place, but pt remained aggressive and focused on escape.

## 2020-06-22 NOTE — Consult Note (Signed)
[facility] Face-to-Face Psychiatry Consult   Reason for Consult:  Suicide attempt  By overdose Referring Physician:  Dr. Maryfrances Bunnell Patient Identification: Ladarrious Leinberger MRN:  [id number] Principal Diagnosis: Acute metabolic encephalopathy Diagnosis:  Principal Problem:   Acute metabolic encephalopathy Active Problems:   Overdose   Anticholinergic drug overdose   Hypokalemia   Schizophrenia (HCC)   Suicide attempt (HCC)   Total Time spent with patient: 30 minutes  Subjective:   Markus Tuy is a 25 y.o. male patient admitted with reported suicide attempt by overdose on Benadryl.  Patient is alert and oriented to self only, and continues to exhibit altered mental status.  He is able to identify his name, however he tells me that he is 25 years old.  Consent is obtained to speak with his mother who is at the bedside.  Unable to perform psychiatric evaluation as patient remains altered at this time.  He does appear to be responding to internal stimuli, as noted to him picking and grabbing at things in the air while in the room.  He also appears to be smiling inappropriately and not responding to verbal stimuli at times. Unable to assess suicidality due to his mentation, however he presented as reported suicide attempt by overdose on benadryl. His symptoms are consistent with such. His labs are normal. UDS negative. BAL 23 and Lithium level undetected.    Per mother patient has been very depressed and discouraged.  She reports due to his serious mental illness he has difficulty maintaining a job, and instability in his livelihood.  She reports patient has been noncompliant with his medication, primarily due to financial constraints.  She reports on several occasions offering to purchase his medication to the ensure his compliance however he would always decline.  Mother is noncontributory in his previous psychiatric history, she reports " likely bipolar or something.  You should have access to his my chart  at [facility] where he was receiving his services previously. "  She reports a history of depression, that is now stable and denies taking any additional medication for the management of such.  She also reports his father has a history of mental illness" I think his father has depression.  I am not sure what he has but it is something."  She denies any immediate concerns at this time, and appears to be grateful that he is going to be admitted to the hospital.  Writer did discuss with mother about long-acting injectables for maintenance of his bipolar disorder and or schizophrenia.  She is also offered some psychoeducation surrounding therapeutic techniques that she can do at home.  In addition to outpatient resources at [facility]), for medication management and therapy.  She acknowledges that patient is without insurance, which is another barrier for him and his stability.  All questions and concerns were addressed, support, and encouragement were offered.    HPI: Jaiyon Jone is a 25 y.o. M with hx schizophrenia or schizoaffective disorder who presents with overdose of Benadryl.  Caveat that the patient is encephalopathic, all history collected from chart.  Attempted to call number listed in chart as his mother/only contact, this is disconnected.  Evidently the patient was in a motel with his girlfriend, crushed up a bottle of Benadryl and ingested three quarters of it with a beer.  He started to get altered and so girlfriend called EMS.  On arrival, the patient was tachycardic to 140, but interactive.  He endorsed ingestion of Benadryl, denied  any intent for self-harm.  While being observed in the ER, the patient became progressively more altered, agitated.  Blood and urine chemistries were notable for potassium 2.9, normal renal function, normal hemogram.  Negative acetaminophen and salicylate levels. Therapeutic lithium level.  Slightly detectable alcohol  level.  Negative UDS.  EKG showed sinus tachycardia, and repeat showed improved sinus tachycardia without QT changes.  ST segments showed early repolarization, no ischemic findings.  Patient given Ativan and placed in restraints due to aggression and confusion.   Past Psychiatric History: Diagnosis unclear chart shows Bipolar, schizophrenia or schizoaffective. Patient is unable to access at this time. Per historical chart review he has a history of taking Lithium, Seroquel, and Hydroxyzine. Multiple inpatient admissions at [facility] per mother.   Risk to Self:  Yes recent suicide attempt by overdose Risk to Others:  Yes altered mental status Prior Inpatient Therapy:  Unavailable Prior Outpatient Therapy:  None  Past Medical History:  Past Medical History:  Diagnosis Date  . Schizophrenia (HCC)    History reviewed. No pertinent surgical history. Family History: History reviewed. No pertinent family history. Family Psychiatric  History: Family history of depression Social History:  Social History   Substance and Sexual Activity  Alcohol Use None     Social History   Substance and Sexual Activity  Drug Use Not on file    Social History   Socioeconomic History  . Marital status: Single    Spouse name: Not on file  . Number of children: Not on file  . Years of education: Not on file  . Highest education level: Not on file  Occupational History  . Not on file  Tobacco Use  . Smoking status: Unknown If Ever Smoked  . Smokeless tobacco: Not on file  Substance and Sexual Activity  . Alcohol use: Not on file  . Drug use: Not on file  . Sexual activity: Not on file  Other Topics Concern  . Not on file  Social History Narrative  . Not on file   Social Determinants of Health   Financial Resource Strain: Not on file  Food Insecurity: Not on file  Transportation Needs: Not on file  Physical Activity: Not on file  Stress: Not on file  Social Connections: Not on file    Additional Social History:    Allergies:  No Known Allergies  Labs:  Results for orders placed or performed during the hospital encounter of 06/21/20 (from the past 48 hour(s))  Magnesium     Status: None   Collection Time: 06/21/20 11:20 PM  Result Value Ref Range   Magnesium 2.0 1.7 - 2.4 mg/dL    Comment: Performed at [facility], [address] [place], [place] [address]  Comprehensive metabolic panel     Status: Abnormal   Collection Time: 06/21/20 11:35 PM  Result Value Ref Range   Sodium 138 135 - 145 mmol/L   Potassium 2.9 (L) 3.5 - 5.1 mmol/L   Chloride 102 98 - 111 mmol/L   CO2 25 22 - 32 mmol/L   Glucose, Bld 114 (H) 70 - 99 mg/dL    Comment: Glucose reference range applies only to samples taken after fasting for at least 8 hours.   BUN 5 (L) 6 - 20 mg/dL   Creatinine, Ser 9.73 0.61 - 1.24 mg/dL   Calcium 9.4 8.9 - 53.2 mg/dL   Total Protein 8.2 (H) 6.5 - 8.1 g/dL   Albumin 4.9 3.5 - 5.0 g/dL  AST 18 15 - 41 U/L   ALT 14 0 - 44 U/L   Alkaline Phosphatase 58 38 - 126 U/L   Total Bilirubin 0.7 0.3 - 1.2 mg/dL   GFR, Estimated >16>60 >10>60 mL/min    Comment: (NOTE) Calculated using the CKD-EPI Creatinine Equation (2021)    Anion gap 11 5 - 15    Comment: Performed at [facility], [address] [place], [place] [address]  Salicylate level     Status: Abnormal   Collection Time: 06/21/20 11:35 PM  Result Value Ref Range   Salicylate Lvl <7.0 (L) 7.0 - 30.0 mg/dL    Comment: Performed at [facility], [address] [place], [place] [address]  Acetaminophen level     Status: Abnormal   Collection Time: 06/21/20 11:35 PM  Result Value Ref Range   Acetaminophen (Tylenol), Serum <10 (L) 10 - 30 ug/mL    Comment: (NOTE) Therapeutic concentrations vary significantly. A range of 10-30 ug/mL  may be an effective concentration for many patients. However, some  are best treated at concentrations outside of this  range. Acetaminophen concentrations >150 ug/mL at 4 hours after ingestion  and >50 ug/mL at 12 hours after ingestion are often associated with  toxic reactions.  Performed at [facility], [address] [place], [place] [address]   Ethanol     Status: Abnormal   Collection Time: 06/21/20 11:35 PM  Result Value Ref Range   Alcohol, Ethyl (B) 23 (H) <10 mg/dL    Comment: (NOTE) Lowest detectable limit for serum alcohol is 10 mg/dL.  For medical purposes only. Performed at [facility], [address] [place], [place] [address]   CBC WITH DIFFERENTIAL     Status: None   Collection Time: 06/21/20 11:35 PM  Result Value Ref Range   WBC 6.0 4.0 - 10.5 K/uL   RBC 4.88 4.22 - 5.81 MIL/uL   Hemoglobin 14.0 13.0 - 17.0 g/dL   HCT 21.341.1 08.639.0 - 57.852.0 %   MCV 84.2 80.0 - 100.0 fL   MCH 28.7 26.0 - 34.0 pg   MCHC 34.1 30.0 - 36.0 g/dL   RDW 46.913.0 62.911.5 - 52.815.5 %   Platelets 248 150 - 400 K/uL   nRBC 0.0 0.0 - 0.2 %   Neutrophils Relative % 56 %   Neutro Abs 3.3 1.7 - 7.7 K/uL   Lymphocytes Relative 34 %   Lymphs Abs 2.1 0.7 - 4.0 K/uL   Monocytes Relative 7 %   Monocytes Absolute 0.4 0.1 - 1.0 K/uL   Eosinophils Relative 3 %   Eosinophils Absolute 0.2 0.0 - 0.5 K/uL   Basophils Relative 0 %   Basophils Absolute 0.0 0.0 - 0.1 K/uL   Immature Granulocytes 0 %   Abs Immature Granulocytes 0.02 0.00 - 0.07 K/uL    Comment: Performed at [facility], [address] [place], [place] [address]  Lithium level     Status: Abnormal   Collection Time: 06/21/20 11:35 PM  Result Value Ref Range   Lithium Lvl 0.01 (L) 0.60 - 1.20 mmol/L    Comment: Performed at [facility], [address] [place], [place] [address]  CK     Status: None   Collection Time: 06/21/20 11:35 PM  Result Value Ref Range   Total CK 181 49 - 397 U/L    Comment: Performed at [facility], [address] [place], [place]  [address]  SARS CORONAVIRUS 2 (TAT 6-24 HRS) Nasopharyngeal Nasopharyngeal Swab     Status: None   Collection Time: 06/21/20 11:36 PM   Specimen: Nasopharyngeal Swab  Result Value Ref Range   SARS Coronavirus 2 NEGATIVE NEGATIVE    Comment: (NOTE) SARS-CoV-2 target nucleic acids are NOT DETECTED.  The SARS-CoV-2 RNA is generally detectable in upper and lower respiratory specimens during the acute phase of infection. Negative results do not preclude SARS-CoV-2 infection, do not rule out co-infections with other pathogens, and should not be used as the sole basis for treatment or other patient management decisions. Negative results must be combined with clinical observations, patient history, and epidemiological information. The expected result is Negative.  Fact Sheet for Patients: HairSlick.no  Fact Sheet for Healthcare Providers: quierodirigir.com  This test is not yet approved or cleared by the [place] FDA and  has been authorized for detection and/or diagnosis of SARS-CoV-2 by FDA under an Emergency Use Authorization (EUA). This EUA will remain  in effect (meaning this test can be used) for the duration of the COVID-19 declaration under Se ction 564(b)(1) of the Act, 21 U.S.C. section 360bbb-3(b)(1), unless the authorization is terminated or revoked sooner.  Performed at [facility], [address] [place], [place] [address]   CBG monitoring, ED     Status: None   Collection Time: 06/21/20 11:40 PM  Result Value Ref Range   Glucose-Capillary 95 70 - 99 mg/dL    Comment: Glucose reference range applies only to samples taken after fasting for at least 8 hours.  Urine rapid drug screen (hosp performed)     Status: None   Collection Time: 06/22/20  2:56 AM  Result Value Ref Range   Opiates NONE DETECTED NONE DETECTED   Cocaine NONE DETECTED NONE DETECTED   Benzodiazepines NONE DETECTED NONE DETECTED    Amphetamines NONE DETECTED NONE DETECTED   Tetrahydrocannabinol NONE DETECTED NONE DETECTED   Barbiturates NONE DETECTED NONE DETECTED    Comment: (NOTE) DRUG SCREEN FOR MEDICAL PURPOSES ONLY.  IF CONFIRMATION IS NEEDED FOR ANY PURPOSE, NOTIFY LAB WITHIN 5 DAYS.  LOWEST DETECTABLE LIMITS FOR URINE DRUG SCREEN Drug Class                     Cutoff (ng/mL) Amphetamine and metabolites    1000 Barbiturate and metabolites    200 Benzodiazepine                 200 Tricyclics and metabolites     300 Opiates and metabolites        300 Cocaine and metabolites        300 THC                            50 Performed at [facility], [address], [place], [place] [address]   Acetaminophen level     Status: Abnormal   Collection Time: 06/22/20  4:26 AM  Result Value Ref Range   Acetaminophen (Tylenol), Serum <10 (L) 10 - 30 ug/mL    Comment: (NOTE) Therapeutic concentrations vary significantly. A range of 10-30 ug/mL  may be an effective concentration for many patients. However, some  are best treated at concentrations outside of this range. Acetaminophen concentrations >150 ug/mL at 4 hours after ingestion  and >50 ug/mL at 12 hours after ingestion are often associated with  toxic reactions.  Performed at [facility]  [address] [place], [place] [address]     Current Facility-Administered Medications  Medication Dose Route Frequency Provider Last Rate Last Admin  . 0.9 % NaCl with KCl 20 mEq/ L  infusion   Intravenous Continuous Alberteen Sam, MD 125 mL/hr at 06/22/20 0947 New Bag at 06/22/20 0947  . acetaminophen (TYLENOL) tablet 650 mg  650 mg Oral Q6H PRN Danford, Earl Lites, MD       Or  . acetaminophen (TYLENOL) suppository 650 mg  650 mg Rectal Q6H PRN Danford, Earl Lites, MD      . enoxaparin (LOVENOX) injection 40 mg  40 mg Subcutaneous Q24H Danford, Earl Lites, MD      . ondansetron (ZOFRAN) tablet 4 mg  4 mg  Oral Q6H PRN Danford, Earl Lites, MD       Or  . ondansetron (ZOFRAN) injection 4 mg  4 mg Intravenous Q6H PRN Danford, Earl Lites, MD       Current Outpatient Medications  Medication Sig Dispense Refill  . hydrOXYzine (VISTARIL) 25 MG capsule Take 25 mg by mouth every 4 (four) hours as needed for anxiety.       Musculoskeletal: Strength & Muscle Tone: within normal limits Gait & Station: normal Patient leans: N/A  Psychiatric Specialty Exam: Physical Exam  Review of Systems  Blood pressure 129/85, pulse 81, temperature 97.9 F (36.6 C), temperature source Axillary, resp. rate 19, height 5\' 10"  (1.778 m), weight 72.6 kg, SpO2 100 %.Body mass index is 22.96 kg/m.  General Appearance: Fairly Groomed  Eye Contact:  Minimal  Speech:  Garbled and Slow  Volume:  Decreased  Mood:  NA  Affect:  Inappropriate  Thought Process:  Disorganized, Irrelevant and Descriptions of Associations: Loose  Orientation:  Other:  alert to self only  Thought Content:  Delusions, Hallucinations: Visual, Paranoid Ideation and Rumination  Suicidal Thoughts:  UTA  Homicidal Thoughts:  No  Memory:  NA  Judgement:  Impaired  Insight:  Lacking  Psychomotor Activity:  Restlessness and psychomotor agitation  Concentration:  Concentration: Poor and Attention Span: Poor  Recall:  Poor  Fund of Knowledge:  Poor  Language:  Poor  Akathisia:  NA  Handed:  Right  AIMS (if indicated):     Assets:  Physical Health Social Support  ADL's:  Impaired  Cognition:  Impaired,  Mild  Sleep:        Treatment Plan Summary: Plan Admit to inpatient psychiatric hospital. At this time recommend starting agitation protocol. Will start olanzapine 5mg  po qhs for paranoia and psychosis. Will repeat another EKG to moniotr QTc prolongation.  -Request chart records from [facility] through care everywhere.  -Recommend working closely with social work to facilitate inpatient admission once medically stable. Patient is being  admitted for observation,he will be 24 hours post ingestion at 11pm.  -TOC consult for insurance and medication assistance.  -Will start Risperdal 1mg  po qhs for acute stabilization of psychosis, paranoia, and psychomotor agitation to include elopement attempts and aggression.  -Will continue ativan 2mg  IV/IM q12hr prn for agitation or psychosis.  - as he recently attempted suicide.  -Continue restraints only if warranted, may increase his risk for rhabdo.   Disposition: Recommend psychiatric Inpatient admission when medically cleared.  [facility], FNP 06/22/2020 1:58 PM

## 2020-06-23 ENCOUNTER — Encounter (HOSPITAL_COMMUNITY): Payer: Self-pay | Admitting: Family Medicine

## 2020-06-23 LAB — BASIC METABOLIC PANEL
Anion gap: 7 (ref 5–15)
BUN: 8 mg/dL (ref 6–20)
CO2: 26 mmol/L (ref 22–32)
Calcium: 9.2 mg/dL (ref 8.9–10.3)
Chloride: 107 mmol/L (ref 98–111)
Creatinine, Ser: 1.02 mg/dL (ref 0.61–1.24)
GFR, Estimated: >60 mL/min (ref >60)
Glucose, Bld: 92 mg/dL (ref 70–99)
Potassium: 3.9 mmol/L (ref 3.5–5.1)
Sodium: 140 mmol/L (ref 135–145)

## 2020-06-23 LAB — CBC
HCT: 39.7 % (ref 39.0–52.0)
Hemoglobin: 13.4 g/dL (ref 13.0–17.0)
MCH: 28.9 pg (ref 26.0–34.0)
MCHC: 33.8 g/dL (ref 30.0–36.0)
MCV: 85.6 fL (ref 80.0–100.0)
Platelets: 209 10*3/uL (ref 150–400)
RBC: 4.64 MIL/uL (ref 4.22–5.81)
RDW: 13.3 % (ref 11.5–15.5)
WBC: 5.9 10*3/uL (ref 4.0–10.5)
nRBC: 0 % (ref 0.0–0.2)

## 2020-06-23 NOTE — Progress Notes (Signed)
Spoke with Verlon Au from poison control with patient updates regarding mental status, VS, and urine output. Verlon Au stated he is out of the window for them to continue to follow and hospital should re contact poison control with any issues or needs.

## 2020-06-23 NOTE — ED Notes (Signed)
Tried calling report to 4th floor, RN in another patients room. RN will call me back.

## 2020-06-23 NOTE — Progress Notes (Signed)
PROGRESS NOTE   Stephen Davis  WNI:[id number] DOB: 1995/08/03 DOA: 06/21/2020 PCP: Patient, No Pcp Per  Brief Narrative:  25 year old male with schizoaffective disorder possible PTSD previously admitted to [facility] in the past year Admitted for intentional Benadryl ingestion with alcohol and an attempt to commit suicide on 1/31 Seen by psychiatry and recommending inpatient psychiatric hospitalization   Assessment & Plan:   Principal Problem:   Acute metabolic encephalopathy Active Problems:   Overdose   Anticholinergic drug overdose   Hypokalemia   Schizophrenia (HCC)   Suicide attempt (HCC)   1. Acute metabolic encephalopathy a. Likely synergism alcohol/Benadryl now more awake alert b. Discontinue restraints as not likely to harm himself c. Keep on monitors to monitor QTC however does not appear prolonged today--can DC monitors in the next 24 hours 2. Suicide attempt a. Psychiatry is seen and recommend inpatient hospital stay we will ask social work to help with the same b. Monitor trends on lorazepam 2 mg twice daily as needed agitation and respiratory 1 mg at bedtime 3. Schizophrenia a. As per above 4. Hypokalemia on admission a. Potassium is corrected discontinue fluids as eating drinking  DVT prophylaxis: Lovenox Code Status: Full Family Communication: None Disposition:  Status is: Inpatient  Remains inpatient appropriate because:Hemodynamically unstable, Ongoing active pain requiring inpatient pain management and Altered mental status   Dispo: The patient is from: Home              Anticipated d/c is to: Psychiatric hospital              Anticipated d/c date is: 3 days              Patient currently is not medically stable to d/c.   Difficult to place patient No       Consultants:   Psychiatry  Procedures: None  Antimicrobials: None   Subjective: Awake coherent slightly sleepy but otherwise feels well no pains no other  complaints  Objective: Vitals:   06/23/20 0337 06/23/20 0348 06/23/20 0401 06/23/20 0851  BP: 103/64 108/76  111/65  Pulse: (!) 55 (!) 53  (!) 44  Resp: 16 14  14   Temp:  97.7 F (36.5 C)  98.2 F (36.8 C)  TempSrc:  Oral  Oral  SpO2: 97% 98%  100%  Weight:   63.8 kg   Height:   5\' 7"  (1.702 m)     Intake/Output Summary (Last 24 hours) at 06/23/2020 1023 Last data filed at 06/23/2020 0400 Gross per 24 hour  Intake 2718.95 ml  Output 2450 ml  Net 268.95 ml   Filed Weights   06/22/20 0855 06/23/20 0401  Weight: 72.6 kg 63.8 kg    Examination:  EOMI NCAT no focal deficit CTAB No added sounds rales rhonchi Abdomen soft no rebound no guarding No lower extremity edema Multiple tattoos Neurologically intact no focal deficit  Data Reviewed: I have personally reviewed following labs and imaging studies  Potassium 3.9 BUN/creatinine 8/1.0 WBC 5.9 Hemoglobin 13  COVID-19 Labs  No results for input(s): DDIMER, FERRITIN, LDH, CRP in the last 72 hours.  Lab Results  Component Value Date   SARSCOV2NAA NEGATIVE 06/21/2020     Radiology Studies: No results found.   Scheduled Meds: . enoxaparin (LOVENOX) injection  40 mg Subcutaneous Q24H  . risperiDONE  1 mg Oral QHS   Continuous Infusions:   LOS: 1 day    Time spent: 53  06/23/2020, MD [facility] To contact the attending provider between  7A-7P or the covering provider during after hours 7P-7A, please log into the web site [email or web address] and access using universal [place] password for that web site. If you do not have the password, please call the hospital operator.  06/23/2020, 10:23 AM

## 2020-06-24 ENCOUNTER — Encounter (HOSPITAL_COMMUNITY): Payer: Self-pay | Admitting: Family Medicine

## 2020-06-24 DIAGNOSIS — T1491XA Suicide attempt, initial encounter: Secondary | ICD-10-CM

## 2020-06-24 DIAGNOSIS — T443X2A Poisoning by other parasympatholytics [anticholinergics and antimuscarinics] and spasmolytics, intentional self-harm, initial encounter: Secondary | ICD-10-CM

## 2020-06-24 DIAGNOSIS — F209 Schizophrenia, unspecified: Secondary | ICD-10-CM

## 2020-06-24 LAB — CBC WITH DIFFERENTIAL/PLATELET
Abs Immature Granulocytes: 0.01 10*3/uL (ref 0.00–0.07)
Basophils Absolute: 0 10*3/uL (ref 0.0–0.1)
Basophils Relative: 0 %
Eosinophils Absolute: 0.2 10*3/uL (ref 0.0–0.5)
Eosinophils Relative: 5 %
HCT: 38.1 % — ABNORMAL LOW (ref 39.0–52.0)
Hemoglobin: 12.8 g/dL — ABNORMAL LOW (ref 13.0–17.0)
Immature Granulocytes: 0 %
Lymphocytes Relative: 48 %
Lymphs Abs: 2.5 10*3/uL (ref 0.7–4.0)
MCH: 28.6 pg (ref 26.0–34.0)
MCHC: 33.6 g/dL (ref 30.0–36.0)
MCV: 85.2 fL (ref 80.0–100.0)
Monocytes Absolute: 0.5 10*3/uL (ref 0.1–1.0)
Monocytes Relative: 9 %
Neutro Abs: 2 10*3/uL (ref 1.7–7.7)
Neutrophils Relative %: 38 %
Platelets: 216 10*3/uL (ref 150–400)
RBC: 4.47 MIL/uL (ref 4.22–5.81)
RDW: 13 % (ref 11.5–15.5)
WBC: 5.3 10*3/uL (ref 4.0–10.5)
nRBC: 0 % (ref 0.0–0.2)

## 2020-06-24 LAB — COMPREHENSIVE METABOLIC PANEL
ALT: 10 U/L (ref 0–44)
AST: 14 U/L — ABNORMAL LOW (ref 15–41)
Albumin: 3.9 g/dL (ref 3.5–5.0)
Alkaline Phosphatase: 53 U/L (ref 38–126)
Anion gap: 11 (ref 5–15)
BUN: 10 mg/dL (ref 6–20)
CO2: 25 mmol/L (ref 22–32)
Calcium: 8.9 mg/dL (ref 8.9–10.3)
Chloride: 104 mmol/L (ref 98–111)
Creatinine, Ser: 0.92 mg/dL (ref 0.61–1.24)
GFR, Estimated: >60 mL/min (ref >60)
Glucose, Bld: 96 mg/dL (ref 70–99)
Potassium: 3.6 mmol/L (ref 3.5–5.1)
Sodium: 140 mmol/L (ref 135–145)
Total Bilirubin: 0.5 mg/dL (ref 0.3–1.2)
Total Protein: 6.4 g/dL — ABNORMAL LOW (ref 6.5–8.1)

## 2020-06-24 MED ORDER — ALUM & MAG HYDROXIDE-SIMETH 200-200-20 MG/5ML PO SUSP
30.0000 mL | ORAL | Status: DC | PRN
  Administered 2020-06-24 – 2020-06-28 (×3): 30 mL via ORAL
  Filled 2020-06-24 (×4): qty 30

## 2020-06-24 NOTE — Progress Notes (Signed)
PROGRESS NOTE  Stephen Davis [id number]:[id number] DOB: June 08, 1995 DOA: 06/21/2020 PCP: Patient, No Pcp Per  Brief History   25 year old man PMH bipolar schizophrenia or schizoaffective disorder on lithium presented with intentional Benadryl overdose, suicide attempt.  Admitted for monitoring, medically stabilized, seen by psychiatry, now medically stable for transfer to psychiatric facility for inpatient treatment.  A & P  Intentional overdose, suicide attempt with significant number of Benadryl tablets with associated anticholinergic toxidrome and acute metabolic encephalopathy. --Treated as per poison control, cleared by poison control, acute issues resolved, now medically stable.  Psychiatric disorder, unclear bipolar, schizophrenia or schizoaffective.  On lithium, Seroquel.  Multiple inpatient admissions at psychiatric facility. --Seen by psychiatry with recommendation for admission to inpatient psychiatric hospital.  Started on Risperdal for acute stabilization.  Continue Recruitment consultant. --Recheck EKG in a.m.  Disposition Plan:  Discussion: medically stable for transfer to inpatient psych facility  Status is: Inpatient  Remains inpatient appropriate because:Unsafe d/c plan   Dispo: The patient is from: Home              Anticipated d/c is to: Inpatient Psychiatric Facility              Anticipated d/c date is: 1 day              Patient currently is medically stable to d/c.   Difficult to place patient No  DVT prophylaxis: enoxaparin (LOVENOX) injection 40 mg Start: 06/22/20 2200   Code Status: Full Code Level of care: Med-Surg Family Communication: none  Brendia Sacks, MD  [facility] Direct contact: see [email or web address] (further directions at bottom of note if needed) 7PM-7AM contact night coverage as at bottom of note 06/24/2020, 5:23 PM  LOS: 2 days   Significant Hospital Events   .    Consults:  .    Procedures:  .   Significant Diagnostic Tests:  Marland Kitchen    Micro  Data:  .    Antimicrobials:  .   Interval History/Subjective  CC: f/u suicide attempt  Feels ok, no complaints, no n/v.  Objective   Vitals:  Vitals:   06/24/20 0448 06/24/20 1223  BP: 125/68 110/70  Pulse: (!) 53 (!) 53  Resp: 16 16  Temp: 98.5 F (36.9 C) 97.8 F (36.6 C)  SpO2: 100% 99%    Exam:  Constitutional:   . Appears calm and comfortable lying in bed, sitter at bedside ENMT:  . grossly normal hearing  Respiratory:  . CTA bilaterally, no w/r/r.  . Respiratory effort normal. Cardiovascular:  . RRR, no m/r/g . No LE extremity edema   . Telemetry SR/SB per RN Musculoskeletal:  . Digits/nails BUE: no clubbing, cyanosis, petechiae, infection Psychiatric:  . Mental status o Mood, affect appropriate o Oriented to person, hospital, year . "I'm here because I tried to kill myself"  I have personally reviewed the following:   Today's Data  . CMP and CBC unremarkable  Scheduled Meds: . enoxaparin (LOVENOX) injection  40 mg Subcutaneous Q24H  . risperiDONE  1 mg Oral QHS   Continuous Infusions:  Principal Problem:   Suicide attempt (HCC) Active Problems:   Overdose   Anticholinergic drug overdose   Acute metabolic encephalopathy   Hypokalemia   Schizophrenia (HCC)   LOS: 2 days   How to contact the [facility] Attending or Consulting provider 7A - 7P or covering provider during after hours 7P -7A, for this patient?  1. Check the care team in [facility] and look for a) attending/consulting TRH  provider listed and b) the [facility] team listed 2. Log into [email or web address] and use [place]'s universal password to access. If you do not have the password, please contact the hospital operator. 3. Locate the [facility] provider you are looking for under [facility] and page to a number that you can be directly reached. 4. If you still have difficulty reaching the provider, please page the [facility] (Director on Call) for the Hospitalists listed on amion for assistance.

## 2020-06-24 NOTE — Progress Notes (Signed)
Report given to Amy on 3W. Patient gave permission for this RN to call his mother with update. Mother called and informed of patient location.

## 2020-06-24 NOTE — Progress Notes (Signed)
Pt sleeping. Sitter in room.  Chaplain offered silent prayers.

## 2020-06-24 NOTE — Hospital Course (Addendum)
25 year old man PMH bipolar schizophrenia or schizoaffective disorder on lithium presented with intentional Benadryl overdose, suicide attempt.  Admitted for monitoring, medically stabilized, seen by psychiatry, remains medically stable for transfer to psychiatric facility for inpatient treatment since 2/2.  A & P  Intentional overdose, suicide attempt with significant number of Benadryl tablets with associated anticholinergic toxidrome and acute metabolic encephalopathy. --Treated as per poison control, cleared by poison control --Remains medically stable since 2/2 --Continue to wait for bed. --IVC renewed 2/7. Continue sitter for safety, suicide precautions.  Psychiatric disorder, unclear bipolar, schizophrenia or schizoaffective.  On lithium, Seroquel.  Multiple inpatient admissions at psychiatric facility. --Seen by psychiatry with recommendation for admission to inpatient psychiatric hospital.  Started on Risperdal for acute stabilization.  Will continue Recruitment consultant. --Continue to wait for bed.

## 2020-06-24 NOTE — Progress Notes (Signed)
Recd pt from 4th floor via w/c, pt has 1 to 1 sitter at his side, all cords removed from room. Pt oriented to unit, and room. Pt denies any SI/HI thoughts at this time. Pt states he feels very safe. No c/o at present

## 2020-06-24 NOTE — Progress Notes (Signed)
Report received from night RN. Per night RN patient pleasured himself with NT in the room during night shift.  This RN set boundaries with patient and patient nodded in agreement of boundaries set.  Stephen Davis A

## 2020-06-25 NOTE — TOC Progression Note (Signed)
Transition of Care [facility]) - Progression Note    Patient Details  Name: Stephen Davis MRN: [id number] Date of Birth: 1996-03-08  Transition of Care [facility]) CM/SW Contact  Darleene Cleaver, [place] Phone Number: 06/24/20 2:00pm  Clinical Narrative:     CSW was informed patient need behavioral health placement, and under IVC.  CSW attempted to contact Behavioral Health, there was no answer.  CSW to follow up at a later time.       Expected Discharge Plan and Services  Behavioral Health.                                               Social Determinants of Health (SDOH) Interventions    Readmission Risk Interventions No flowsheet data found.

## 2020-06-25 NOTE — Progress Notes (Signed)
Patient denies any suicidal ideation at this time, feels safe, appears calm, carrying on appropriate conversation with this Clinical research associate, body systems WNL, sitter at bedside, gave prn maalox for slight indigestion, vss, will continue to monitor.

## 2020-06-25 NOTE — TOC Initial Note (Addendum)
Transition of Care [facility]) - Initial/Assessment Note    Patient Details  Name: Stephen Davis MRN: [id number] Date of Birth: 10-24-1995  Transition of Care [facility]) CM/SW Contact:    Darleene Cleaver, LCSW Phone Number: 06/23/20 2:00pm Clinical Narrative:                  CSW was informed that patient is under IVC and needs behavioral health placement.  Patient was admitted for drug overdose, attempted suicide.  Patient is medically clear and needs behavioral health bed.  CSW attempted to contact [facility], unable to reach anyone at [facility].  CSW will attempt at a later time.  Expected Discharge Plan: Psychiatric Hospital [facility]) Barriers to Discharge: Psych Bed not available   Patient Goals and CMS Choice  Patient to go to behavioral health once med is available.      Expected Discharge Plan and Services Expected Discharge Plan: Psychiatric Hospital (Behavioral Health) In-house Referral: Clinical Social Work     Living arrangements for the past 2 months: Single Family Home                                      Prior Living Arrangements/Services Living arrangements for the past 2 months: Single Family Home Lives with:: Parents Patient language and need for interpreter reviewed:: Yes Do you feel safe going back to the place where you live?: No   Patient under IVC  Need for Family Participation in Patient Care: No (Comment) Care giver support system in place?: No (comment)   Criminal Activity/Legal Involvement Pertinent to Current Situation/Hospitalization: No - Comment as needed  Activities of Daily Living Home Assistive Devices/Equipment: None ADL Screening (condition at time of admission) Patient's cognitive ability adequate to safely complete daily activities?: No (patient very sleepy) Is the patient deaf or have difficulty hearing?: No Does the patient have difficulty seeing, even when wearing glasses/contacts?: No Does the patient have  difficulty concentrating, remembering, or making decisions?: Yes Patient able to express need for assistance with ADLs?: Yes Does the patient have difficulty dressing or bathing?: Yes Independently performs ADLs?: No Communication: Independent Dressing (OT): Needs assistance Is this a change from baseline?: Change from baseline, expected to last <3days Grooming: Needs assistance Is this a change from baseline?: Change from baseline, expected to last <3 days Feeding: Needs assistance Is this a change from baseline?: Change from baseline, expected to last <3 days Bathing: Needs assistance Is this a change from baseline?: Change from baseline, expected to last <3 days Toileting: Needs assistance Is this a change from baseline?: Change from baseline, expected to last <3 days In/Out Bed: Needs assistance Is this a change from baseline?: Change from baseline, expected to last <3 days Walks in Home: Needs assistance Is this a change from baseline?: Change from baseline, expected to last <3 days Does the patient have difficulty walking or climbing stairs?: Yes Weakness of Legs: Both Weakness of Arms/Hands: None  Permission Sought/Granted Permission sought to share information with : Facility Industrial/product designer granted to share information with : Yes, Release of Information Signed  Share Information with NAME: Behavioral Health           Emotional Assessment Appearance:: Appears stated age   Affect (typically observed): Calm,Accepting Orientation: : Oriented to Self,Oriented to Place,Oriented to  Time,Oriented to Situation Alcohol / Substance Use: Alcohol Use Psych Involvement: Yes (comment)  Admission diagnosis:  Overdose [  T50.901A] Suicide attempt (HCC) [T14.91XA] Intentional drug overdose, initial encounter [facility]) [T50.902A] Patient Active Problem List   Diagnosis Date Noted  . Overdose 06/22/2020  . Anticholinergic drug overdose 06/22/2020  . Acute metabolic  encephalopathy 06/22/2020  . Hypokalemia 06/22/2020  . Schizophrenia (HCC) 06/22/2020  . Suicide attempt (HCC) 06/22/2020   PCP:  Patient, No Pcp Per Pharmacy:   [facility] [place], [place] - [address] [place] [address] Phone: [phone] Fax: [phone]  [id number] [place], [place] - [address] AT [facility] [address] [place] [address] Phone: [phone] Fax: [phone]     Social Determinants of Health (SDOH) Interventions    Readmission Risk Interventions No flowsheet data found.

## 2020-06-25 NOTE — Progress Notes (Signed)
PROGRESS NOTE  Stephen Davis ZOX:[id number] DOB: 1995/11/14 DOA: 06/21/2020 PCP: Patient, No Pcp Per  Brief History   25 year old man PMH bipolar schizophrenia or schizoaffective disorder on lithium presented with intentional Benadryl overdose, suicide attempt.  Admitted for monitoring, medically stabilized, seen by psychiatry, remains medically stable for transfer to psychiatric facility for inpatient treatment since 2/2.  A & P  Intentional overdose, suicide attempt with significant number of Benadryl tablets with associated anticholinergic toxidrome and acute metabolic encephalopathy. --Treated as per poison control, cleared by poison control --Remains medically stable since 2/2  Psychiatric disorder, unclear bipolar, schizophrenia or schizoaffective.  On lithium, Seroquel.  Multiple inpatient admissions at psychiatric facility. --Seen by psychiatry with recommendation for admission to inpatient psychiatric hospital.  Started on Risperdal for acute stabilization.  We will continue Recruitment consultant. --EKG shows normal QTC.  Independently reviewed.  Sinus rhythm, LVH with repolarization abnormality.  Disposition Plan:  Discussion: medically stable for transfer to inpatient psych facility  Status is: Inpatient  Remains inpatient appropriate because:Unsafe d/c plan   Dispo: The patient is from: Home              Anticipated d/c is to: Inpatient Psychiatric Facility              Anticipated d/c date is: when bed available              Patient currently is medically stable to d/c. since 2/2   Difficult to place patient No  DVT prophylaxis: enoxaparin (LOVENOX) injection 40 mg Start: 06/22/20 2200   Code Status: Full Code Level of care: Med-Surg Family Communication: none  Brendia Sacks, MD  [facility] Direct contact: see [email or web address] (further directions at bottom of note if needed) 7PM-7AM contact night coverage as at bottom of note 06/25/2020, 10:02 AM  LOS: 3 days    Consults:  . Psychiatry    Interval History/Subjective  CC: f/u suicide attempt  No issues overnight.  Eating fine.  Breathing fine.  Objective   Vitals:  Vitals:   06/24/20 2205 06/25/20 0618  BP: 116/69 110/70  Pulse: (!) 50 (!) 53  Resp: 15 16  Temp: 98.6 F (37 C) (!) 97.5 F (36.4 C)  SpO2: 100% 100%    Exam:  Constitutional:   . Appears calm and comfortable ENMT:  . grossly normal hearing  Respiratory:  . CTA bilaterally, no w/r/r.  . Respiratory effort normal.  Cardiovascular:  . RRR, no m/r/g . No LE extremity edema   Psychiatric:  . Mental status o Mood, affect appropriate  I have personally reviewed the following:   Today's Data  . No new data  Scheduled Meds: . enoxaparin (LOVENOX) injection  40 mg Subcutaneous Q24H  . risperiDONE  1 mg Oral QHS   Continuous Infusions:  Principal Problem:   Suicide attempt (HCC) Active Problems:   Overdose   Anticholinergic drug overdose   Acute metabolic encephalopathy   Hypokalemia   Schizophrenia (HCC)   LOS: 3 days   How to contact the [facility] Attending or Consulting provider 7A - 7P or covering provider during after hours 7P -7A, for this patient?  1. Check the care team in [facility] and look for a) attending/consulting TRH provider listed and b) the [facility] team listed 2. Log into [email or web address] and use [place]'s universal password to access. If you do not have the password, please contact the hospital operator. 3. Locate the [facility] provider you are looking for under [facility] and page to a number  that you can be directly reached. 4. If you still have difficulty reaching the provider, please page the [facility] (Director on Call) for the Hospitalists listed on amion for assistance.

## 2020-06-25 NOTE — Progress Notes (Signed)
Patient sleeping, sitter at bedside, no acute distress noted, will continue to monitor.

## 2020-06-25 NOTE — TOC Progression Note (Signed)
Transition of Care [facility]) - Progression Note    Patient Details  Name: Deloy Archey MRN: [id number] Date of Birth: 10/09/1995  Transition of Care [facility]) CM/SW Contact  Clearance Coots, LCSW Phone Number: 06/25/2020, 11:04 AM  Clinical Narrative:    CSW reached out Behavior Health and made a referral to [place] [facility]. The patient is currently being reviewed by the [facility].   Expected Discharge Plan: Psychiatric Hospital [facility]) Barriers to Discharge: Psych Bed not available  Expected Discharge Plan and Services Expected Discharge Plan: Psychiatric Hospital [facility]) In-house Referral: Clinical Social Work     Living arrangements for the past 2 months: Single Family Home                                       Social Determinants of Health (SDOH) Interventions    Readmission Risk Interventions No flowsheet data found.

## 2020-06-25 NOTE — Plan of Care (Signed)
  Problem: Safety: Goal: Ability to remain free from injury will improve Outcome: Progressing   Problem: Coping: Goal: Level of anxiety will decrease Outcome: Progressing   Problem: Nutrition: Goal: Adequate nutrition will be maintained Outcome: Progressing

## 2020-06-26 DIAGNOSIS — T50902A Poisoning by unspecified drugs, medicaments and biological substances, intentional self-harm, initial encounter: Secondary | ICD-10-CM

## 2020-06-26 NOTE — Progress Notes (Signed)
PROGRESS NOTE  Faizaan Trenda Moots VQQ:[id number] DOB: 07-Mar-1996 DOA: 06/21/2020 PCP: Patient, No Pcp Per  Brief History   25 year old man PMH bipolar schizophrenia or schizoaffective disorder on lithium presented with intentional Benadryl overdose, suicide attempt.  Admitted for monitoring, medically stabilized, seen by psychiatry, remains medically stable for transfer to psychiatric facility for inpatient treatment since 2/2.  A & P  Intentional overdose, suicide attempt with significant number of Benadryl tablets with associated anticholinergic toxidrome and acute metabolic encephalopathy. --Treated as per poison control, cleared by poison control --Remains medically stable since 2/2 --await bed  Psychiatric disorder, unclear bipolar, schizophrenia or schizoaffective.  On lithium, Seroquel.  Multiple inpatient admissions at psychiatric facility. --Seen by psychiatry with recommendation for admission to inpatient psychiatric hospital.  Started on Risperdal for acute stabilization.  We will continue Recruitment consultant. --awaiting bed  Disposition Plan:  Discussion: remains medically stable for transfer to inpatient psych facility  Status is: Inpatient  Remains inpatient appropriate because:Unsafe d/c plan   Dispo: The patient is from: Home              Anticipated d/c is to: Inpatient Psychiatric Facility              Anticipated d/c date is: when bed available              Patient currently is medically stable to d/c. since 2/2   Difficult to place patient No  DVT prophylaxis: enoxaparin (LOVENOX) injection 40 mg Start: 06/22/20 2200   Code Status: Full Code Level of care: Med-Surg Family Communication: none  Brendia Sacks, MD  [facility] Direct contact: see [email or web address] (further directions at bottom of note if needed) 7PM-7AM contact night coverage as at bottom of note 06/26/2020, 5:31 PM  LOS: 4 days   Consults:  . Psychiatry    Interval History/Subjective  CC: f/u  suicide attempt  Feels fine. No complaints.  Objective   Vitals:  Vitals:   06/26/20 0651 06/26/20 1323  BP: 99/60 105/66  Pulse: (!) 51 (!) 45  Resp: 16 16  Temp: 98.3 F (36.8 C)   SpO2: 100% 100%    Exam:  Constitutional:   . Appears calm and comfortable ENMT:  . grossly normal hearing  Respiratory:  . CTA bilaterally, no w/r/r.  . Respiratory effort normal.  Cardiovascular:  . RRR, no m/r/g Psychiatric:  . Mental status o Mood, affect appropriate  I have personally reviewed the following:   Today's Data  . No new data  Scheduled Meds: . enoxaparin (LOVENOX) injection  40 mg Subcutaneous Q24H  . risperiDONE  1 mg Oral QHS   Continuous Infusions:  Principal Problem:   Suicide attempt (HCC) Active Problems:   Overdose   Anticholinergic drug overdose   Acute metabolic encephalopathy   Hypokalemia   Schizophrenia (HCC)   LOS: 4 days   How to contact the [facility] Attending or Consulting provider 7A - 7P or covering provider during after hours 7P -7A, for this patient?  1. Check the care team in [facility] and look for a) attending/consulting TRH provider listed and b) the [facility] team listed 2. Log into [email or web address] and use [place]'s universal password to access. If you do not have the password, please contact the hospital operator. 3. Locate the [facility] provider you are looking for under [facility] and page to a number that you can be directly reached. 4. If you still have difficulty reaching the provider, please page the [facility] (Director on Call) for the Hospitalists  listed on amion for assistance.

## 2020-06-26 NOTE — Plan of Care (Signed)
Plan of care reviewed and discussed with the patient. 

## 2020-06-26 NOTE — TOC Progression Note (Addendum)
Transition of Care [facility]) - Progression Note    Patient Details  Name: Stephen Davis MRN: [id number] Date of Birth: November 01, 1995  Transition of Care [facility]) CM/SW Contact  Clearance Coots, LCSW Phone Number: 06/26/2020, 4:10 PM  Clinical Narrative:   CSW informed [facility] does not have a bed today and was informed to call disposition in the morning.  Expected Discharge Plan: [facility]) Barriers to Discharge: Psych Bed not available  Expected Discharge Plan and Services Expected Discharge Plan: Psychiatric Hospital [facility]) In-house Referral: Clinical Social Work     Living arrangements for the past 2 months: Single Family Home                                       Social Determinants of Health (SDOH) Interventions    Readmission Risk Interventions No flowsheet data found.

## 2020-06-26 NOTE — TOC Progression Note (Signed)
Transition of Care [facility]) - Progression Note    Patient Details  Name: Stephen Davis MRN: [id number] Date of Birth: [birth year]  Transition of Care [facility]) CM/SW Contact  Clearance Coots, LCSW Phone Number: 06/26/2020, 8:48 AM  Clinical Narrative:    CSW updated by [facility] staff [place]. The [facility] is still reviewing for bed placement. She provide updates when a decision is made.   Expected Discharge Plan: Psychiatric Hospital [facility]) Barriers to Discharge: Psych Bed not available  Expected Discharge Plan and Services Expected Discharge Plan: [facility] Hospital [facility]) In-house Referral: Clinical Social Work     Living arrangements for the past 2 months: Single Family Home                                       Social Determinants of Health (SDOH) Interventions    Readmission Risk Interventions No flowsheet data found.

## 2020-06-27 MED ORDER — NICOTINE 14 MG/24HR TD PT24
14.0000 mg | MEDICATED_PATCH | Freq: Every day | TRANSDERMAL | Status: DC
  Administered 2020-06-27 – 2020-07-02 (×4): 14 mg via TRANSDERMAL
  Filled 2020-06-27 (×6): qty 1

## 2020-06-27 NOTE — Plan of Care (Signed)
  Problem: Health Behavior/Discharge Planning: Goal: Ability to manage health-related needs will improve Outcome: Progressing   Problem: Safety: Goal: Ability to remain free from injury will improve Outcome: Progressing   Problem: Clinical Measurements: Goal: Ability to maintain clinical measurements within normal limits will improve Outcome: Progressing

## 2020-06-27 NOTE — Consult Note (Addendum)
[facility] Face-to-Face Psychiatry Consult   Reason for Consult:  Suicide attempt  By overdose Referring Physician:  Dr. Maryfrances Bunnell Patient Identification: Stephen Davis MRN:  [id number] Principal Diagnosis: Suicide attempt [facility]) Diagnosis:  Principal Problem:   Suicide attempt [facility]) Active Problems:   Overdose   Anticholinergic drug overdose   Acute metabolic encephalopathy   Hypokalemia   Schizophrenia (HCC)   Total Time spent with patient: 15 minutes  Subjective:   Stephen Davis is a 25 y.o. male patient admitted with  suicide attempt by overdose on Benadryl on 1/31 that required medical admission for stabilization. Psychiatry was consulted at that time and patient was found to be alert and oriented to self only and appeared to be RIS. Collateral collected on 1/31 from mother revealed that patient has been very depressed and discouraged.  She reports due to his serious mental illness he has difficulty maintaining a job, and instability in his livelihood.  She reports patient has been noncompliant with his medication, primarily due to financial constraints.  Psychiatry re-consulted today as he is medically cleared. Pt is AAO x 4. Pt reports that that he intentionally overdosed on benadryl with intent to end his life. He denies active SI currently but acknowledges the seriousness of his attempt and understands why inpatient admission is needed.  He denied HI/AVH.   HPI: Stephen Davis is a 78 y.o. M with hx schizophrenia or schizoaffective disorder who presents with overdose of Benadryl.  Caveat that the patient is encephalopathic, all history collected from chart.  Attempted to call number listed in chart as his mother/only contact, this is disconnected.  Evidently the patient was in a motel with his girlfriend, crushed up a bottle of Benadryl and ingested three quarters of it with a beer.  He started to get altered and so girlfriend called EMS.  On arrival, the patient was tachycardic to 140,  but interactive.  He endorsed ingestion of Benadryl, denied any intent for self-harm.  While being observed in the ER, the patient became progressively more altered, agitated.  Blood and urine chemistries were notable for potassium 2.9, normal renal function, normal hemogram.  Negative acetaminophen and salicylate levels. Therapeutic lithium level.  Slightly detectable alcohol level.  Negative UDS.  EKG showed sinus tachycardia, and repeat showed improved sinus tachycardia without QT changes.  ST segments showed early repolarization, no ischemic findings.  Patient given Ativan and placed in restraints due to aggression and confusion.   Past Psychiatric History: Diagnosis unclear chart shows Bipolar, schizophrenia or schizoaffective. Patient is unable to access at this time. Per historical chart review he has a history of taking Lithium, Seroquel, and Hydroxyzine. Multiple inpatient admissions at [facility] per mother.   Risk to Self:  Yes recent suicide attempt by overdose Risk to Others:  Yes altered mental status Prior Inpatient Therapy:  Unavailable Prior Outpatient Therapy:  None  Past Medical History:  Past Medical History:  Diagnosis Date  . Schizophrenia (HCC)    History reviewed. No pertinent surgical history. Family History: History reviewed. No pertinent family history. Family Psychiatric  History: Family history of depression Social History:  Social History   Substance and Sexual Activity  Alcohol Use None     Social History   Substance and Sexual Activity  Drug Use Not on file    Social History   Socioeconomic History  . Marital status: Single    Spouse name: Not on file  . Number of children: Not on file  . Years of education: Not on file  .  Highest education level: Not on file  Occupational History  . Not on file  Tobacco Use  . Smoking status: Current Every Day Smoker    Packs/day: 1.50    Types: Cigarettes  . Smokeless tobacco: Never Used  Substance and  Sexual Activity  . Alcohol use: Not on file  . Drug use: Not on file  . Sexual activity: Not on file  Other Topics Concern  . Not on file  Social History Narrative  . Not on file   Social Determinants of Health   Financial Resource Strain: Not on file  Food Insecurity: Not on file  Transportation Needs: Not on file  Physical Activity: Not on file  Stress: Not on file  Social Connections: Not on file   Additional Social History:    Allergies:  No Known Allergies  Labs:  No results found for this or any previous visit (from the past 48 hour(s)).  Current Facility-Administered Medications  Medication Dose Route Frequency Provider Last Rate Last Admin  . acetaminophen (TYLENOL) tablet 650 mg  650 mg Oral Q6H PRN Danford, Earl Lites, MD       Or  . acetaminophen (TYLENOL) suppository 650 mg  650 mg Rectal Q6H PRN Danford, Earl Lites, MD      . alum & mag hydroxide-simeth (MAALOX/MYLANTA) 200-200-20 MG/5ML suspension 30 mL  30 mL Oral Q4H PRN Alberteen Sam, MD   30 mL at 06/24/20 2150  . enoxaparin (LOVENOX) injection 40 mg  40 mg Subcutaneous Q24H Alberteen Sam, MD   40 mg at 06/26/20 2247  . ondansetron (ZOFRAN) tablet 4 mg  4 mg Oral Q6H PRN Danford, Earl Lites, MD       Or  . ondansetron (ZOFRAN) injection 4 mg  4 mg Intravenous Q6H PRN Danford, Earl Lites, MD      . risperiDONE (RISPERDAL) tablet 1 mg  1 mg Oral QHS Maryagnes Amos, FNP   1 mg at 06/26/20 2248    Musculoskeletal: Strength & Muscle Tone: within normal limits Gait & Station: normal Patient leans: N/A  Psychiatric Specialty Exam: Physical Exam Constitutional:      Appearance: Normal appearance. He is normal weight.  HENT:     Head: Normocephalic and atraumatic.  Eyes:     Extraocular Movements: Extraocular movements intact.  Pulmonary:     Effort: Pulmonary effort is normal.  Neurological:     Mental Status: He is alert.     Review of Systems   Psychiatric/Behavioral: Negative for hallucinations. The patient is not nervous/anxious.     Blood pressure 121/74, pulse (!) 50, temperature 98.3 F (36.8 C), temperature source Oral, resp. rate 18, height 5\' 7"  (1.702 m), weight 63.8 kg, SpO2 100 %.Body mass index is 22.03 kg/m.  General Appearance: Fairly Groomed  Eye Contact:  Fair  Speech:  Clear and Coherent and Normal Rate  Volume:  Normal  Mood:  Dysphoric  Affect:  Appropriate, Congruent and Constricted  Thought Process:  Coherent, Goal Directed and Linear  Orientation:  Other:  alert to self only  Thought Content:  WDL  Suicidal Thoughts:  denies currently but admitted currently for SA requiring medical admission  Homicidal Thoughts:  No  Memory:  Immediate;   Fair Recent;   limited Remote;   limited  Judgement:  Impaired  Insight:  Present and improving  Psychomotor Activity:  Normal  Concentration:  Concentration: Fair and Attention Span: Fair  Recall:  of Knowledge:  Fair  Language:  Good  Akathisia:  No  Handed:  Right  AIMS (if indicated):     Assets:  Physical Health Social Support  ADL's:  Intact  Cognition:  WNL  Sleep:        Treatment Plan Summary:    Patient continues to meet criteria for inpatient admission as he is at a high risk for suicide and is currently admitted for a suicide attempt that required a medical admission.   The patient demonstrates the following risk factors for suicide: Chronic risk factors for suicide include: psychiatric disorder of reported bipolar disorder/schizophrenia and previous suicide attempts (currently admitted for SA). Acute risk factors for suicide include: unemployment, loss (financial, interpersonal, professional) and recent suicide attempt. Protective factors for this patient include: positive social support. Considering these factors, the overall suicide risk at this point appears to be high. Patient is not appropriate for outpatient follow up at  this time   -[facility] consult for insurance and medication assistance.  -Will start Risperdal 1mg  po qhs for acute stabilization of psychosis, paranoia, and psychomotor agitation to include elopement attempts and aggression.  -Will continue ativan 2mg  IV/IM q12hr prn for agitation or psychosis.  - as he recently attempted suicide.  -Continue restraints only if warranted, may increase his risk for rhabdo.   Disposition: Recommend psychiatric Inpatient admission when medically cleared.   Recommendations communicated to Dr. and [facility] via secure chat.   Psychiatry will sign off. Please re-consult if further assistance is needed.  Irene Limbo, MD 06/27/2020 4:11 PM

## 2020-06-27 NOTE — Progress Notes (Signed)
  PROGRESS NOTE  Stephen Davis ZOX:[id number] DOB: May 24, 1995 DOA: 06/21/2020 PCP: Patient, No Pcp Per  Brief History   25 year old man PMH bipolar schizophrenia or schizoaffective disorder on lithium presented with intentional Benadryl overdose, suicide attempt.  Admitted for monitoring, medically stabilized, seen by psychiatry, remains medically stable for transfer to psychiatric facility for inpatient treatment since 2/2.  A & P  Intentional overdose, suicide attempt with significant number of Benadryl tablets with associated anticholinergic toxidrome and acute metabolic encephalopathy. --Treated as per poison control, cleared by poison control --Remains medically stable since 2/2 --awaiting bed  Psychiatric disorder, unclear bipolar, schizophrenia or schizoaffective.  On lithium, Seroquel.  Multiple inpatient admissions at psychiatric facility. --Seen by psychiatry with recommendation for admission to inpatient psychiatric hospital.  Started on Risperdal for acute stabilization.  Will continue Recruitment consultant. --awaiting bed  Disposition Plan:  Discussion: remains medically stable for transfer to inpatient psych facility  Status is: Inpatient  Remains inpatient appropriate because:Unsafe d/c plan   Dispo: The patient is from: Home              Anticipated d/c is to: Inpatient Psychiatric Facility              Anticipated d/c date is: when bed available              Patient currently is medically stable to d/c. since 2/2   Difficult to place patient No  DVT prophylaxis: enoxaparin (LOVENOX) injection 40 mg Start: 06/22/20 2200   Code Status: Full Code Level of care: Med-Surg Family Communication: none  Brendia Sacks, MD  [facility] Direct contact: see [email or web address] (further directions at bottom of note if needed) 7PM-7AM contact night coverage as at bottom of note 06/27/2020, 5:15 PM  LOS: 5 days   Consults:  . Psychiatry    Interval History/Subjective  CC: f/u  suicide attempt  Feels fine  Objective   Vitals:  Vitals:   06/27/20 0541 06/27/20 1407  BP: 109/79 121/74  Pulse: (!) 55 (!) 50  Resp: 16 18  Temp: 97.7 F (36.5 C) 98.3 F (36.8 C)  SpO2: 100% 100%    Exam: Appears calm and comfortable Speech fluent and clear  I have personally reviewed the following:   Today's Data  . No new data  Scheduled Meds: . enoxaparin (LOVENOX) injection  40 mg Subcutaneous Q24H  . nicotine  14 mg Transdermal Daily  . risperiDONE  1 mg Oral QHS   Continuous Infusions:  Principal Problem:   Suicide attempt [facility]) Active Problems:   Overdose   Anticholinergic drug overdose   Acute metabolic encephalopathy   Hypokalemia   Schizophrenia (HCC)   LOS: 5 days   How to contact the [facility] Attending or Consulting provider 7A - 7P or covering provider during after hours 7P -7A, for this patient?  1. Check the care team in [facility] and look for a) attending/consulting TRH provider listed and b) the [facility] team listed 2. Log into [email or web address] and use [place]'s universal password to access. If you do not have the password, please contact the hospital operator. 3. Locate the [facility] provider you are looking for under [facility] and page to a number that you can be directly reached. 4. If you still have difficulty reaching the provider, please page the [facility] (Director on Call) for the Hospitalists listed on amion for assistance.

## 2020-06-27 NOTE — TOC Progression Note (Signed)
Transition of Care [facility]) - Progression Note    Patient Details  Name: Stephen Davis MRN: [id number] Date of Birth: 1995-09-25  Transition of Care [facility]) CM/SW Contact  Armanda Heritage, RN Phone Number: 06/27/2020, 1:49 PM  Clinical Narrative:    No bed available at [facility] at this time.  Have messaged MD with requested for psych to see again, once a more recent psych note is in place [facility] can fax patient to area psych facilities to expand search.    Expected Discharge Plan: Psychiatric Hospital [facility]) Barriers to Discharge: Psych Bed not available  Expected Discharge Plan and Services Expected Discharge Plan: Psychiatric Hospital [facility]) In-house Referral: Clinical Social Work     Living arrangements for the past 2 months: Single Family Home                                       Social Determinants of Health (SDOH) Interventions    Readmission Risk Interventions No flowsheet data found.

## 2020-06-27 NOTE — Plan of Care (Signed)
Plan of care reviewed with the patient. 

## 2020-06-28 MED ORDER — SENNA 8.6 MG PO TABS
1.0000 | ORAL_TABLET | Freq: Every day | ORAL | Status: DC
  Administered 2020-06-28 – 2020-07-02 (×4): 8.6 mg via ORAL
  Filled 2020-06-28 (×5): qty 1

## 2020-06-28 MED ORDER — POLYETHYLENE GLYCOL 3350 17 G PO PACK: 17.0000 g | PACK | Freq: Two times a day (BID) | ORAL | Status: DC | PRN

## 2020-06-28 NOTE — Plan of Care (Signed)
  Problem: Safety: Goal: Ability to remain free from injury will improve Outcome: Progressing   

## 2020-06-28 NOTE — Plan of Care (Signed)
  Problem: Safety: Goal: Ability to remain free from injury will improve Outcome: Progressing   Problem: Clinical Measurements: Goal: Ability to maintain clinical measurements within normal limits will improve Outcome: Progressing   Problem: Health Behavior/Discharge Planning: Goal: Ability to manage health-related needs will improve Outcome: Progressing   

## 2020-06-28 NOTE — Progress Notes (Signed)
Patient calm and cooperative, denies any suicidal ideation, feels safe in this environment , vss, 1:1 sitter present at the bedside, will monitor.

## 2020-06-28 NOTE — Progress Notes (Signed)
PROGRESS NOTE  Stephen Davis WUJ:[id number] DOB: June 15, 1995 DOA: 06/21/2020 PCP: Patient, No Pcp Per  Brief History   25 year old man PMH bipolar schizophrenia or schizoaffective disorder on lithium presented with intentional Benadryl overdose, suicide attempt.  Admitted for monitoring, medically stabilized, seen by psychiatry, remains medically stable for transfer to psychiatric facility for inpatient treatment since 2/2.  A & P  Intentional overdose, suicide attempt with significant number of Benadryl tablets with associated anticholinergic toxidrome and acute metabolic encephalopathy. --Treated as per poison control, cleared by poison control --Remains medically stable since 2/2 --Continue to wait for bed  Psychiatric disorder, unclear bipolar, schizophrenia or schizoaffective.  On lithium, Seroquel.  Multiple inpatient admissions at psychiatric facility. --Seen by psychiatry with recommendation for admission to inpatient psychiatric hospital.  Started on Risperdal for acute stabilization.  Will continue Recruitment consultant. --Continue to wait for bed  Disposition Plan:  Discussion: Continues to remain medically stable for transfer to inpatient psych facility  Status is: Inpatient  Remains inpatient appropriate because:Unsafe d/c plan   Dispo: The patient is from: Home              Anticipated d/c is to: Inpatient Psychiatric Facility              Anticipated d/c date is: when bed available              Patient currently is medically stable to d/c. since 2/2   Difficult to place patient No  DVT prophylaxis: enoxaparin (LOVENOX) injection 40 mg Start: 06/22/20 2200   Code Status: Full Code Level of care: Med-Surg Family Communication: none  Brendia Sacks, MD  [facility] Direct contact: see [email or web address] (further directions at bottom of note if needed) 7PM-7AM contact night coverage as at bottom of note 06/28/2020, 1:08 PM  LOS: 6 days   Consults:  . Psychiatry    Interval  History/Subjective  CC: f/u suicide attempt  No new issues  Objective   Vitals:  Vitals:   06/28/20 0547 06/28/20 1202  BP: 116/73 102/67  Pulse: 60 (!) 52  Resp: 16 16  Temp: 97.8 F (36.6 C) 97.8 F (36.6 C)  SpO2: 99% 100%    Exam: Constitutional:   . Appears calm and comfortable Respiratory:  . CTA bilaterally, no w/r/r.  . Respiratory effort normal.  Cardiovascular:  . RRR, no m/r/g Psychiatric:  . Mental status o Mood, affect appropriate  I have personally reviewed the following:   Today's Data  . No new data  Scheduled Meds: . enoxaparin (LOVENOX) injection  40 mg Subcutaneous Q24H  . nicotine  14 mg Transdermal Daily  . risperiDONE  1 mg Oral QHS   Continuous Infusions:  Principal Problem:   Suicide attempt [facility]) Active Problems:   Overdose   Anticholinergic drug overdose   Acute metabolic encephalopathy   Hypokalemia   Schizophrenia (HCC)   LOS: 6 days   How to contact the [facility] Attending or Consulting provider 7A - 7P or covering provider during after hours 7P -7A, for this patient?  1. Check the care team in [facility] and look for a) attending/consulting TRH provider listed and b) the [facility] team listed 2. Log into [email or web address] and use [place]'s universal password to access. If you do not have the password, please contact the hospital operator. 3. Locate the [facility] provider you are looking for under [facility] and page to a number that you can be directly reached. 4. If you still have difficulty reaching the provider, please page  the [facility] (Director on Call) for the Hospitalists listed on amion for assistance.

## 2020-06-29 NOTE — TOC Progression Note (Signed)
Transition of Care [facility]) - Progression Note    Patient Details  Name: Stephen Davis MRN: [id number] Date of Birth: May 23, 1996  Transition of Care [facility]) CM/SW Contact  Armanda Heritage, RN Phone Number: 06/29/2020, 12:53 PM  Clinical Narrative:    [facility], no beds at this time.  Bed search expanded to include all psych facilities in the area.  IVC paperwork re-filed, current IVC good through 07/07/19.    Expected Discharge Plan: Psychiatric Hospital [facility]) Barriers to Discharge: Psych Bed not available  Expected Discharge Plan and Services Expected Discharge Plan: Psychiatric Hospital [facility]) In-house Referral: Clinical Social Work     Living arrangements for the past 2 months: Single Family Home                                       Social Determinants of Health (SDOH) Interventions    Readmission Risk Interventions No flowsheet data found.

## 2020-06-29 NOTE — Progress Notes (Signed)
PROGRESS NOTE  Shilo Trenda Moots VHQ:[id number] DOB: Sep 02, 1995 DOA: 06/21/2020 PCP: Patient, No Pcp Per  Brief History   25 year old man PMH bipolar schizophrenia or schizoaffective disorder on lithium presented with intentional Benadryl overdose, suicide attempt.  Admitted for monitoring, medically stabilized, seen by psychiatry, remains medically stable for transfer to psychiatric facility for inpatient treatment since 2/2.  A & P  Intentional overdose, suicide attempt with significant number of Benadryl tablets with associated anticholinergic toxidrome and acute metabolic encephalopathy. --Treated as per poison control, cleared by poison control --Remains medically stable since 2/2 --Continue to wait for bed. --IVC renewed  Psychiatric disorder, unclear bipolar, schizophrenia or schizoaffective.  On lithium, Seroquel.  Multiple inpatient admissions at psychiatric facility. --Seen by psychiatry with recommendation for admission to inpatient psychiatric hospital.  Started on Risperdal for acute stabilization.  Will continue Recruitment consultant. --Continue to wait for bed.  Disposition Plan:  Discussion: Continues to remain medically stable for transfer to inpatient psych facility  Status is: Inpatient  Remains inpatient appropriate because:Unsafe d/c plan   Dispo: The patient is from: Home              Anticipated d/c is to: Inpatient Psychiatric Facility              Anticipated d/c date is: when bed available              Patient currently is medically stable to d/c. since 2/2   Difficult to place patient No  DVT prophylaxis: enoxaparin (LOVENOX) injection 40 mg Start: 06/22/20 2200   Code Status: Full Code Level of care: Med-Surg Family Communication: none  Brendia Sacks, MD  [facility] Direct contact: see [email or web address] (further directions at bottom of note if needed) 7PM-7AM contact night coverage as at bottom of note 06/29/2020, 4:03 PM  LOS: 7 days   Consults:   . Psychiatry    Interval History/Subjective  CC: f/u suicide attempt  Feels fine  Objective   Vitals:  Vitals:   06/29/20 0532 06/29/20 1327  BP: 111/82 (!) 126/96  Pulse: (!) 58 (!) 53  Resp: 17 16  Temp: 98 F (36.7 C)   SpO2: 94% 100%    Exam: Constitutional:   . Appears calm and comfortable Psychiatric:  . Mental status o Mood, affect appropriate  I have personally reviewed the following:   Today's Data  . No new data  Scheduled Meds: . enoxaparin (LOVENOX) injection  40 mg Subcutaneous Q24H  . nicotine  14 mg Transdermal Daily  . risperiDONE  1 mg Oral QHS  . senna  1 tablet Oral QHS   Continuous Infusions:  Principal Problem:   Suicide attempt [facility]) Active Problems:   Overdose   Anticholinergic drug overdose   Acute metabolic encephalopathy   Hypokalemia   Schizophrenia (HCC)   LOS: 7 days   How to contact the [facility] Attending or Consulting provider 7A - 7P or covering provider during after hours 7P -7A, for this patient?  1. Check the care team in [facility] and look for a) attending/consulting TRH provider listed and b) the [facility] team listed 2. Log into [email or web address] and use [place]'s universal password to access. If you do not have the password, please contact the hospital operator. 3. Locate the [facility] provider you are looking for under [facility] and page to a number that you can be directly reached. 4. If you still have difficulty reaching the provider, please page the [facility] (Director on Call) for the Hospitalists listed on amion  for assistance.

## 2020-06-29 NOTE — Plan of Care (Signed)
  Problem: Health Behavior/Discharge Planning: Goal: Ability to manage health-related needs will improve Outcome: Progressing   

## 2020-06-30 DIAGNOSIS — G9341 Metabolic encephalopathy: Secondary | ICD-10-CM | POA: Diagnosis not present

## 2020-06-30 NOTE — TOC Progression Note (Signed)
Transition of Care [facility]) - Progression Note    Patient Details  Name: Edenilson Austad MRN: [id number] Date of Birth: 1995-06-29  Transition of Care [facility]) CM/SW Contact  Armanda Heritage, RN Phone Number: 06/30/2020, 1:41 PM  Clinical Narrative:    CM reached out to [facility] and [facility] once again for inpatient psychiatric bed.  No beds at this time.  [facility] [facility] reports possibility of bed opening tomorrow, TOC will need to reach out tomorrow to facility.  CM called and followed up on referrals that were faxed out yesterday for an extended bed search.  VM left for Minda Meo, and [facility].  [facility] beds [facility] at [facility]. TOC continues to search for inpatient bed, no beds at this time.    Expected Discharge Plan: [facility]) Barriers to Discharge: Psych Bed not available  Expected Discharge Plan and Services Expected Discharge Plan: Psychiatric Hospital [facility]) In-house Referral: Clinical Social Work     Living arrangements for the past 2 months: Single Family Home                                       Social Determinants of Health (SDOH) Interventions    Readmission Risk Interventions No flowsheet data found.

## 2020-06-30 NOTE — Consult Note (Signed)
  HPI: Stephen Braggsis a 25 y.o.Mwith hx schizophrenia or schizoaffective disorderwho presents with overdose of Benadryl.  Caveat that the patient is encephalopathic, all history collected from chart. Attempted to call number listed in chart as his mother/onlycontact, this is disconnected.  Evidently the patient was in a motel with his girlfriend, crushed up a bottle of Benadryl and ingested three quarters of it with a beer. He started to get altered and so girlfriend called EMS.  On arrival, the patient was tachycardic to 140, but interactive. He endorsed ingestion of Benadryl, denied any intent for self-harm.While being observed in the ER, the patient became progressively more altered, agitated.Blood and urine chemistries were notable for potassium 2.9, normal renal function, normal hemogram. Negative acetaminophen and salicylate levels. Therapeutic lithium level. Slightly detectable alcohol level. Negative UDS. EKG showed sinus tachycardia, and repeat showed improved sinus tachycardia without QT changes. ST segments showed early repolarization, no ischemic findings.  Patient given Ativan and placed in restraints due to aggression and confusion.   Psychiatry re-consulted today as he is medically cleared. Pt is AAO x 4. Pt reports that that he intentionally overdosed on benadryl with intent to end his life. He denies active SI currently but acknowledges the seriousness of his attempt and understands why inpatient admission is needed.  He denied HI/AVH. Patient has not required prn medications in over 24 hours.   -Recommend continue working with SW to facilitate inpatient admission.  -Patient is to remain under IVC, and continue sitter as he continues to be a risk to himself.  _Patient continues to meet inpatient criteria at this time due to recent suicide attempt by overdose, with an attempt to end his life.

## 2020-06-30 NOTE — Progress Notes (Signed)
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  Admitted for monitoring, medically stabilized, seen by psychiatry, remains medically stable for transfer to psychiatric facility for inpatient treatment since 2/2.  A & P  Intentional overdose, suicide attempt with significant number of Benadryl tablets with associated anticholinergic toxidrome and acute metabolic encephalopathy. --Treated as per poison control, cleared by poison control --Remains medically stable since 2/2 --Continue to wait for bed. --IVC renewed 2/7. Continue sitter for safety, suicide precautions.  Psychiatric disorder, unclear bipolar, schizophrenia or schizoaffective.  On lithium, Seroquel.  Multiple inpatient admissions at psychiatric facility. --Seen by psychiatry with recommendation for admission to inpatient psychiatric hospital.  Started on Risperdal for acute stabilization.  Will continue Recruitment consultant. --Continue to wait for bed.  Disposition Plan:  Discussion: Continues to remain medically stable for transfer to inpatient psych facility  Status is: Inpatient  Remains inpatient appropriate because:Unsafe d/c plan   Dispo: The patient is from: Home              Anticipated d/c is to: Inpatient Psychiatric Facility              Anticipated d/c date is: when bed available              Patient currently is medically stable to d/c. since 2/2   Difficult to place patient No  DVT prophylaxis: enoxaparin (LOVENOX) injection 40 mg Start: 06/22/20 2200   Code Status: Full Code Level of care: Med-Surg Family Communication: none  Brendia Sacks, MD  [facility] Direct contact: see [email or web address] (further directions at bottom of note if needed) 7PM-7AM contact night coverage as at bottom of  note 06/30/2020, 5:49 PM  LOS: 8 days   Consults:  . Psychiatry    Interval History/Subjective  CC: f/u suicide attempt  Feels ok  Objective   Vitals:  Vitals:   06/30/20 0553 06/30/20 1435  BP: 108/75 110/77  Pulse: (!) 44 72  Resp: 16 16  Temp: 98.3 F (36.8 C)   SpO2: 100% 100%    Exam: Constitutional:   . Appears calm and comfortable Respiratory:  . CTA bilaterally, no w/r/r.  . Respiratory effort normal.  Cardiovascular:  . RRR, no m/r/g Psychiatric:  . Mental status o Mood, affect appropriate  I have personally reviewed the following:   Today's Data  . No new data  Scheduled Meds: . enoxaparin (LOVENOX) injection  40 mg Subcutaneous Q24H  . nicotine  14 mg Transdermal Daily  . risperiDONE  1 mg Oral QHS  . senna  1 tablet Oral QHS   Continuous Infusions:  Principal Problem:   Suicide attempt [facility]) Active Problems:   Overdose   Anticholinergic drug overdose   Acute metabolic encephalopathy   Hypokalemia   Schizophrenia (HCC)   LOS: 8 days   How to contact the [facility] Attending or Consulting provider 7A - 7P or covering provider during after hours 7P -7A, for this patient?  1. Check the care team in [facility] and look for a) attending/consulting TRH provider listed and b) the [facility] team listed 2. Log into [email or web address] and use [facility]'s universal password to access. If you do not have the password, please contact the hospital operator. 3. Locate the [facility] provider you are looking for under [facility] and page to a number that you can be  directly reached. 4. If you still have difficulty reaching the provider, please page the [facility] (Director on Call) for the Hospitalists listed on amion for assistance.

## 2020-06-30 NOTE — Plan of Care (Signed)
Plan of care reviewed. Continue current plan until [facility] bed available.

## 2020-06-30 NOTE — Progress Notes (Signed)
Per AC Danika, [facility] currently does not have an appropriate bed for the patient at this time.  Helma Argyle MSW,LCSWA,LCASA Clinical Social Worker  [facility] Disposition [phone] (cell) 

## 2020-07-01 NOTE — TOC Progression Note (Signed)
Transition of Care [facility]) - Progression Note    Patient Details  Name: Stephen Davis MRN: [id number] Date of Birth: 09-22-1995  Transition of Care [facility]) CM/SW Contact  Ida Rogue, [place] Phone Number: 07/01/2020, 8:15 AM  Clinical Narrative:   Reached out to both [facility] and [facility] to inquire about bed status, and possible admission for this patient.TOC will continue to follow during the course of hospitalization.     Expected Discharge Plan: Psych[facility] Hospital [facility]) Barriers to Discharge: Psych Bed not available  Expected Discharge Plan and Services Expected Discharge Plan: Psychiatric Hospital [facility]) In-house Referral: Clinical Social Work     Living arrangements for the past 2 months: Single Family Home                                       Social Determinants of Health (SDOH) Interventions    Readmission Risk Interventions No flowsheet data found.

## 2020-07-01 NOTE — Progress Notes (Signed)
PROGRESS NOTE    Stephen Davis  BSJ:[id number] DOB: Oct 22, 1995 DOA: 06/21/2020 PCP: Patient, No Pcp Per     Brief Narrative:  Stephen Davis is a 25 year old man PMH bipolar schizophrenia or schizoaffective disorder on lithium who presented with intentional Benadryl overdose, suicide attempt.  Admitted for monitoring, medically stabilized, seen by psychiatry, remains medically stable for transfer to psychiatric facility for inpatient treatment since 2/2.  New events last 24 hours / Subjective: Sitter at bedside.  Patient without any physical complaints.  States that his mood has been improving.  Assessment & Plan:   Principal Problem:   Suicide attempt [facility]) Active Problems:   Overdose   Anticholinergic drug overdose   Acute metabolic encephalopathy   Hypokalemia   Schizophrenia (HCC)   Intentional overdose, suicide attempt with significant number of Benadryl tablets with associated anticholinergic toxidrome and acute metabolic encephalopathy. --Treated as per poison control, cleared by poison control --Remains medically stable since 2/2 --Continue to wait for bed. --IVC renewed 2/7. Continue sitter for safety, suicide precautions.  Psychiatric disorder, unclear bipolar, schizophrenia or schizoaffective.  On lithium, Seroquel.  Multiple inpatient admissions at psychiatric facility. --Seen by psychiatry with recommendation for admission to inpatient psychiatric hospital.  Started on Risperdal for acute stabilization.  Will continue Recruitment consultant. --Continue to wait for bed.   DVT prophylaxis:  enoxaparin (LOVENOX) injection 40 mg Start: 06/22/20 2200  Code Status: Full Family Communication: None at bedside Disposition Plan:  Status is: Inpatient  Remains inpatient appropriate because:Unsafe d/c plan   Dispo: The patient is from: Home              Anticipated d/c is to: Inpatient psych              Anticipated d/c date is: 1 day              Patient currently is  medically stable to d/c.   Difficult to place patient No   Antimicrobials:  Anti-infectives (From admission, onward)   None        Objective: Vitals:   06/30/20 1435 06/30/20 2050 07/01/20 0617 07/01/20 1342  BP: 110/77 123/74 109/72 107/68  Pulse: 72 60 62 (!) 51  Resp: 16 16 16 16   Temp:  98.2 F (36.8 C) 97.9 F (36.6 C) 98.6 F (37 C)  TempSrc:  Oral Oral Oral  SpO2: 100% 100% 100% 100%  Weight:      Height:        Intake/Output Summary (Last 24 hours) at 07/01/2020 1540 Last data filed at 07/01/2020 0900 Gross per 24 hour  Intake 710 ml  Output --  Net 710 ml   Filed Weights   06/22/20 0855 06/23/20 0401  Weight: 72.6 kg 63.8 kg    Examination:  General exam: Appears calm and comfortable  Respiratory system: Clear to auscultation. Respiratory effort normal. No respiratory distress. No conversational dyspnea.  Cardiovascular system: S1 & S2 heard, RRR. No murmurs. No pedal edema. Gastrointestinal system: Abdomen is nondistended, soft and nontender. Normal bowel sounds heard. Central nervous system: Alert and oriented. No focal neurological deficits. Speech clear.  Extremities: Symmetric in appearance  Skin: No rashes, lesions or ulcers on exposed skin  Psychiatry: Judgement and insight appear normal. Mood & affect appropriate.   Data Reviewed: I have personally reviewed following labs and imaging studies  CBC: No results for input(s): WBC, NEUTROABS, HGB, HCT, MCV, PLT in the last 168 hours. Basic Metabolic Panel: No results for input(s): NA, K, CL, CO2, GLUCOSE,  BUN, CREATININE, CALCIUM, MG, PHOS in the last 168 hours. GFR: Estimated Creatinine Clearance: 112.7 mL/min (by C-G formula based on SCr of 0.92 mg/dL). Liver Function Tests: No results for input(s): AST, ALT, ALKPHOS, BILITOT, PROT, ALBUMIN in the last 168 hours. No results for input(s): LIPASE, AMYLASE in the last 168 hours. No results for input(s): AMMONIA in the last 168 hours. Coagulation  Profile: No results for input(s): INR, PROTIME in the last 168 hours. Cardiac Enzymes: No results for input(s): CKTOTAL, CKMB, CKMBINDEX, TROPONINI in the last 168 hours. BNP (last 3 results) No results for input(s): PROBNP in the last 8760 hours. HbA1C: No results for input(s): HGBA1C in the last 72 hours. CBG: No results for input(s): GLUCAP in the last 168 hours. Lipid Profile: No results for input(s): CHOL, HDL, LDLCALC, TRIG, CHOLHDL, LDLDIRECT in the last 72 hours. Thyroid Function Tests: No results for input(s): TSH, T4TOTAL, FREET4, T3FREE, THYROIDAB in the last 72 hours. Anemia Panel: No results for input(s): VITAMINB12, FOLATE, FERRITIN, TIBC, IRON, RETICCTPCT in the last 72 hours. Sepsis Labs: No results for input(s): PROCALCITON, LATICACIDVEN in the last 168 hours.  Recent Results (from the past 240 hour(s))  SARS CORONAVIRUS 2 (TAT 6-24 HRS) Nasopharyngeal Nasopharyngeal Swab     Status: None   Collection Time: 06/21/20 11:36 PM   Specimen: Nasopharyngeal Swab  Result Value Ref Range Status   SARS Coronavirus 2 NEGATIVE NEGATIVE Final    Comment: (NOTE) SARS-CoV-2 target nucleic acids are NOT DETECTED.  The SARS-CoV-2 RNA is generally detectable in upper and lower respiratory specimens during the acute phase of infection. Negative results do not preclude SARS-CoV-2 infection, do not rule out co-infections with other pathogens, and should not be used as the sole basis for treatment or other patient management decisions. Negative results must be combined with clinical observations, patient history, and epidemiological information. The expected result is Negative.  Fact Sheet for Patients: HairSlick.no  Fact Sheet for Healthcare Providers: quierodirigir.com  This test is not yet approved or cleared by the [place] FDA and  has been authorized for detection and/or diagnosis of SARS-CoV-2 by FDA under an  Emergency Use Authorization (EUA). This EUA will remain  in effect (meaning this test can be used) for the duration of the COVID-19 declaration under Se ction 564(b)(1) of the Act, 21 U.S.C. section 360bbb-3(b)(1), unless the authorization is terminated or revoked sooner.  Performed at [facility], [address] [place], [place] [address]       Radiology Studies: No results found.    Scheduled Meds: . enoxaparin (LOVENOX) injection  40 mg Subcutaneous Q24H  . nicotine  14 mg Transdermal Daily  . risperiDONE  1 mg Oral QHS  . senna  1 tablet Oral QHS   Continuous Infusions:   LOS: 9 days      Time spent: 15 minutes   Noralee Stain, DO [facility] 07/01/2020, 3:40 PM   Available via Epic secure chat 7am-7pm After these hours, please refer to coverage provider listed on [email or web address]

## 2020-07-02 LAB — HIV ANTIBODY (ROUTINE TESTING W REFLEX): HIV Screen 4th Generation wRfx: NONREACTIVE

## 2020-07-02 NOTE — TOC Progression Note (Signed)
Transition of Care [facility]) - Progression Note    Patient Details  Name: Stephen Davis MRN: [id number] Date of Birth: 14-Apr-1996  Transition of Care [facility]) CM/SW Contact  Ida Rogue, [place] Phone Number: 07/02/2020, 4:25 PM  Clinical Narrative:   Jerilynn Som at [facility] expressed interest in patient, asked for an updated COVID test.  MD messaged and order received.  Messaged Calvin to let him know that he would need to reach out to ED social worker until 7PM if they need help arranging transportation today yet.  TOC will continue to follow during the course of hospitalization.     Expected Discharge Plan: Psychiatric Hospital [facility]) Barriers to Discharge: Psych Bed not available  Expected Discharge Plan and Services Expected Discharge Plan: Psych[facility] Hospital [facility]) In-house Referral: Clinical Social Work     Living arrangements for the past 2 months: Single Family Home                                       Social Determinants of Health (SDOH) Interventions    Readmission Risk Interventions No flowsheet data found.

## 2020-07-02 NOTE — Progress Notes (Signed)
PROGRESS NOTE    Stephen Davis  QTM:[id number] DOB: March 26, 1996 DOA: 06/21/2020 PCP: Patient, No Pcp Per     Brief Narrative:  Stephen Davis is a 25 year old man PMH bipolar schizophrenia or schizoaffective disorder on lithium who presented with intentional Benadryl overdose, suicide attempt.  Admitted for monitoring, medically stabilized, seen by psychiatry, remains medically stable for transfer to psychiatric facility for inpatient treatment since 2/2.  New events last 24 hours / Subjective: No complaints.  Assessment & Plan:   Principal Problem:   Suicide attempt [facility]) Active Problems:   Overdose   Anticholinergic drug overdose   Acute metabolic encephalopathy   Hypokalemia   Schizophrenia (HCC)   Intentional overdose, suicide attempt with significant number of Benadryl tablets with associated anticholinergic toxidrome and acute metabolic encephalopathy. --Treated as per poison control, cleared by poison control --Remains medically stable since 2/2 --Continue to wait for bed. --IVC renewed 2/7. Continue sitter for safety, suicide precautions.  Psychiatric disorder, unclear bipolar, schizophrenia or schizoaffective.  On lithium, Seroquel.  Multiple inpatient admissions at psychiatric facility. --Seen by psychiatry with recommendation for admission to inpatient psychiatric hospital.  Started on Risperdal for acute stabilization.  Will continue Recruitment consultant. --Continue to wait for bed.   DVT prophylaxis:  enoxaparin (LOVENOX) injection 40 mg Start: 06/22/20 2200  Code Status: Full Family Communication: None at bedside Disposition Plan:  Status is: Inpatient  Remains inpatient appropriate because:Unsafe d/c plan   Dispo: The patient is from: Home              Anticipated d/c is to: Inpatient psych              Anticipated d/c date is: 1 day              Patient currently is medically stable to d/c.  Awaiting psych placement   Difficult to place patient  No   Antimicrobials:  Anti-infectives (From admission, onward)   None       Objective: Vitals:   07/01/20 0617 07/01/20 1342 07/01/20 2059 07/02/20 0500  BP: 109/72 107/68 (!) 126/96 95/67  Pulse: 62 (!) 51 93 (!) 57  Resp: 16 16 19 18   Temp: 97.9 F (36.6 C) 98.6 F (37 C) 97.7 F (36.5 C) 97.8 F (36.6 C)  TempSrc: Oral Oral Oral Oral  SpO2: 100% 100% 100% 100%  Weight:      Height:        Intake/Output Summary (Last 24 hours) at 07/02/2020 1157 Last data filed at 07/01/2020 2200 Gross per 24 hour  Intake 840 ml  Output --  Net 840 ml   Filed Weights   06/22/20 0855 06/23/20 0401  Weight: 72.6 kg 63.8 kg    Examination: General exam: Appears calm and comfortable  Respiratory system: Respiratory effort normal. Central nervous system: Alert and oriented. Speech clear  Psychiatry: Judgement and insight appear stable.   Data Reviewed: I have personally reviewed following labs and imaging studies  CBC: No results for input(s): WBC, NEUTROABS, HGB, HCT, MCV, PLT in the last 168 hours. Basic Metabolic Panel: No results for input(s): NA, K, CL, CO2, GLUCOSE, BUN, CREATININE, CALCIUM, MG, PHOS in the last 168 hours. GFR: Estimated Creatinine Clearance: 112.7 mL/min (by C-G formula based on SCr of 0.92 mg/dL). Liver Function Tests: No results for input(s): AST, ALT, ALKPHOS, BILITOT, PROT, ALBUMIN in the last 168 hours. No results for input(s): LIPASE, AMYLASE in the last 168 hours. No results for input(s): AMMONIA in the last 168 hours. Coagulation  Profile: No results for input(s): INR, PROTIME in the last 168 hours. Cardiac Enzymes: No results for input(s): CKTOTAL, CKMB, CKMBINDEX, TROPONINI in the last 168 hours. BNP (last 3 results) No results for input(s): PROBNP in the last 8760 hours. HbA1C: No results for input(s): HGBA1C in the last 72 hours. CBG: No results for input(s): GLUCAP in the last 168 hours. Lipid Profile: No results for input(s): CHOL,  HDL, LDLCALC, TRIG, CHOLHDL, LDLDIRECT in the last 72 hours. Thyroid Function Tests: No results for input(s): TSH, T4TOTAL, FREET4, T3FREE, THYROIDAB in the last 72 hours. Anemia Panel: No results for input(s): VITAMINB12, FOLATE, FERRITIN, TIBC, IRON, RETICCTPCT in the last 72 hours. Sepsis Labs: No results for input(s): PROCALCITON, LATICACIDVEN in the last 168 hours.  No results found for this or any previous visit (from the past 240 hour(s)).    Radiology Studies: No results found.    Scheduled Meds: . enoxaparin (LOVENOX) injection  40 mg Subcutaneous Q24H  . nicotine  14 mg Transdermal Daily  . risperiDONE  1 mg Oral QHS  . senna  1 tablet Oral QHS   Continuous Infusions:   LOS: 10 days      Time spent: 15 minutes   Noralee Stain, DO [facility] 07/02/2020, 11:57 AM   Available via Epic secure chat 7am-7pm After these hours, please refer to coverage provider listed on [email or web address]

## 2020-07-02 NOTE — Plan of Care (Signed)
  Problem: Health Behavior/Discharge Planning: Goal: Ability to manage health-related needs will improve Outcome: Progressing   Problem: Clinical Measurements: Goal: Ability to maintain clinical measurements within normal limits will improve Outcome: Progressing   Problem: Safety: Goal: Ability to remain free from injury will improve Outcome: Progressing   

## 2020-07-03 ENCOUNTER — Encounter: Payer: Self-pay | Admitting: Behavioral Health

## 2020-07-03 ENCOUNTER — Other Ambulatory Visit: Payer: Self-pay

## 2020-07-03 ENCOUNTER — Inpatient Hospital Stay
Admission: AD | Admit: 2020-07-03 | Discharge: 2020-07-07 | DRG: 885 | Disposition: A | Discharge status: Home / Self Care | Payer: 59 | Source: Intra-hospital | Attending: Behavioral Health | Admitting: Behavioral Health

## 2020-07-03 DIAGNOSIS — F4310 Post-traumatic stress disorder, unspecified: Secondary | ICD-10-CM | POA: Diagnosis present

## 2020-07-03 DIAGNOSIS — F314 Bipolar disorder, current episode depressed, severe, without psychotic features: Principal | ICD-10-CM | POA: Diagnosis present

## 2020-07-03 DIAGNOSIS — Z87891 Personal history of nicotine dependence: Secondary | ICD-10-CM | POA: Diagnosis not present

## 2020-07-03 DIAGNOSIS — Z9151 Personal history of suicidal behavior: Secondary | ICD-10-CM

## 2020-07-03 DIAGNOSIS — T50901A Poisoning by unspecified drugs, medicaments and biological substances, accidental (unintentional), initial encounter: Secondary | ICD-10-CM | POA: Diagnosis present

## 2020-07-03 LAB — GC/CHLAMYDIA PROBE AMP (~~LOC~~) NOT AT ARMC
Chlamydia: NEGATIVE
Comment: NEGATIVE
Comment: NORMAL
Neisseria Gonorrhea: NEGATIVE

## 2020-07-03 LAB — RPR: RPR Ser Ql: NONREACTIVE

## 2020-07-03 LAB — SARS CORONAVIRUS 2 (TAT 6-24 HRS): SARS Coronavirus 2: NEGATIVE

## 2020-07-03 MED ORDER — RISPERIDONE 1 MG PO TABS
1.0000 mg | ORAL_TABLET | Freq: Every day | ORAL | Status: DC
  Administered 2020-07-03: 1 mg via ORAL
  Filled 2020-07-03: qty 1

## 2020-07-03 MED ORDER — MAGNESIUM HYDROXIDE 400 MG/5ML PO SUSP: 30.0000 mL | Freq: Every day | ORAL | Status: DC | PRN

## 2020-07-03 MED ORDER — ACETAMINOPHEN 325 MG PO TABS
650.0000 mg | ORAL_TABLET | Freq: Four times a day (QID) | ORAL | Status: DC | PRN
  Administered 2020-07-06: 650 mg via ORAL
  Filled 2020-07-03: qty 2

## 2020-07-03 MED ORDER — SENNA 8.6 MG PO TABS
1.0000 | ORAL_TABLET | Freq: Every day | ORAL | Status: DC
  Administered 2020-07-03 – 2020-07-04 (×2): 8.6 mg via ORAL
  Filled 2020-07-03 (×5): qty 1

## 2020-07-03 MED ORDER — ALUM & MAG HYDROXIDE-SIMETH 200-200-20 MG/5ML PO SUSP: 30.0000 mL | ORAL | Status: DC | PRN

## 2020-07-03 MED ORDER — NICOTINE 14 MG/24HR TD PT24
14.0000 mg | MEDICATED_PATCH | Freq: Every day | TRANSDERMAL | Status: DC
  Administered 2020-07-05 – 2020-07-07 (×3): 14 mg via TRANSDERMAL
  Filled 2020-07-03 (×4): qty 1

## 2020-07-03 MED ORDER — RISPERIDONE 1 MG PO TABS: 1.0000 mg | ORAL_TABLET | Freq: Every day | ORAL | Status: DC

## 2020-07-03 MED ORDER — HYDROXYZINE HCL 25 MG PO TABS: 25.0000 mg | ORAL_TABLET | ORAL | Status: DC | PRN

## 2020-07-03 MED ORDER — TRAZODONE HCL 50 MG PO TABS
50.0000 mg | ORAL_TABLET | Freq: Every evening | ORAL | Status: DC | PRN
  Administered 2020-07-03 – 2020-07-06 (×3): 50 mg via ORAL
  Filled 2020-07-03 (×4): qty 1

## 2020-07-03 MED ORDER — HYDROXYZINE PAMOATE 25 MG PO CAPS
25.0000 mg | ORAL_CAPSULE | ORAL | Status: DC | PRN
  Filled 2020-07-03: qty 1

## 2020-07-03 NOTE — Discharge Summary (Signed)
Physician Discharge Summary  Stephen Davis NMM:[id number] DOB: [birth year] DOA: 06/21/2020  PCP: Patient, No Pcp Per  Admit date: 06/21/2020 Discharge date: 07/03/2020  Admitted From: Home Disposition:  Psych hospital   Discharge Condition: Stable CODE STATUS: Full  Diet recommendation:  Diet Orders (From admission, onward)    Start     Ordered   06/22/20 0800  Diet regular Room service appropriate? Yes; Fluid consistency: Thin  Diet effective now       Question Answer Comment  Room service appropriate? Yes   Fluid consistency: Thin      06/22/20 0801         Brief/Interim Summary: Stephen Davis is a 25 year old man PMH bipolar schizophrenia or schizoaffective disorder on lithium who presented with intentional Benadryl overdose, suicide attempt. Admitted for monitoring, medically stabilized, seen by psychiatry, remains medically stable for transfer to psychiatric facility for inpatient treatment since 2/2. He requested STI work up; HIV, RPR, GC/Chlamydia negative. He was discharged to behavioral health hospital in stable condition.  Discharge Diagnoses:  Principal Problem:   Suicide attempt [facility]) Active Problems:   Overdose   Anticholinergic drug overdose   Acute metabolic encephalopathy   Hypokalemia   Schizophrenia (HCC)   Discharge Instructions   Allergies as of 07/03/2020   No Known Allergies     Medication List    STOP taking these medications   hydrOXYzine 25 MG capsule Commonly known as: VISTARIL     TAKE these medications   risperiDONE 1 MG tablet Commonly known as: RISPERDAL Take 1 tablet (1 mg total) by mouth at bedtime.       No Known Allergies   Discharge Exam: Vitals:   07/02/20 2156 07/03/20 0705  BP: 123/77 115/69  Pulse: 72 (!) 52  Resp: 18 17  Temp: 97.8 F (36.6 C) 97.6 F (36.4 C)  SpO2: 100% 100%     General: Pt is alert, awake, not in acute distress Cardiovascular: RRR, S1/S2 +, no edema Respiratory: CTA bilaterally, no  wheezing, no rhonchi, no respiratory distress, no conversational dyspnea  Abdominal: Soft, NT, ND, bowel sounds + Extremities: no edema, no cyanosis Psych: Normal mood and affect, stable judgement and insight     The results of significant diagnostics from this hospitalization (including imaging, microbiology, ancillary and laboratory) are listed below for reference.     Microbiology: Recent Results (from the past 240 hour(s))  SARS CORONAVIRUS 2 (TAT 6-24 HRS) Nasopharyngeal Nasopharyngeal Swab     Status: None   Collection Time: 07/02/20  3:12 PM   Specimen: Nasopharyngeal Swab  Result Value Ref Range Status   SARS Coronavirus 2 NEGATIVE NEGATIVE Final    Comment: (NOTE) SARS-CoV-2 target nucleic acids are NOT DETECTED.  The SARS-CoV-2 RNA is generally detectable in upper and lower respiratory specimens during the acute phase of infection. Negative results do not preclude SARS-CoV-2 infection, do not rule out co-infections with other pathogens, and should not be used as the sole basis for treatment or other patient management decisions. Negative results must be combined with clinical observations, patient history, and epidemiological information. The expected result is Negative.  Fact Sheet for Patients: HairSlick.no  Fact Sheet for Healthcare Providers: quierodirigir.com  This test is not yet approved or cleared by the [place] FDA and  has been authorized for detection and/or diagnosis of SARS-CoV-2 by FDA under an Emergency Use Authorization (EUA). This EUA will remain  in effect (meaning this test can be used) for the duration of the COVID-19 declaration under Se  ction 564(b)(1) of the Act, 21 U.S.C. section 360bbb-3(b)(1), unless the authorization is terminated or revoked sooner.  Performed at [facility], [address], [place], [place] [address]      Labs: BNP (last 3 results) No results for  input(s): BNP in the last 8760 hours. Basic Metabolic Panel: No results for input(s): NA, K, CL, CO2, GLUCOSE, BUN, CREATININE, CALCIUM, MG, PHOS in the last 168 hours. Liver Function Tests: No results for input(s): AST, ALT, ALKPHOS, BILITOT, PROT, ALBUMIN in the last 168 hours. No results for input(s): LIPASE, AMYLASE in the last 168 hours. No results for input(s): AMMONIA in the last 168 hours. CBC: No results for input(s): WBC, NEUTROABS, HGB, HCT, MCV, PLT in the last 168 hours. Cardiac Enzymes: No results for input(s): CKTOTAL, CKMB, CKMBINDEX, TROPONINI in the last 168 hours. BNP: Invalid input(s): POCBNP CBG: No results for input(s): GLUCAP in the last 168 hours. D-Dimer No results for input(s): DDIMER in the last 72 hours. Hgb A1c No results for input(s): HGBA1C in the last 72 hours. Lipid Profile No results for input(s): CHOL, HDL, LDLCALC, TRIG, CHOLHDL, LDLDIRECT in the last 72 hours. Thyroid function studies No results for input(s): TSH, T4TOTAL, T3FREE, THYROIDAB in the last 72 hours.  Invalid input(s): FREET3 Anemia work up No results for input(s): VITAMINB12, FOLATE, FERRITIN, TIBC, IRON, RETICCTPCT in the last 72 hours. Urinalysis No results found for: COLORURINE, APPEARANCEUR, LABSPEC, PHURINE, GLUCOSEU, HGBUR, BILIRUBINUR, KETONESUR, PROTEINUR, UROBILINOGEN, NITRITE, LEUKOCYTESUR Sepsis Labs Invalid input(s): PROCALCITONIN,  WBC,  LACTICIDVEN Microbiology Recent Results (from the past 240 hour(s))  SARS CORONAVIRUS 2 (TAT 6-24 HRS) Nasopharyngeal Nasopharyngeal Swab     Status: None   Collection Time: 07/02/20  3:12 PM   Specimen: Nasopharyngeal Swab  Result Value Ref Range Status   SARS Coronavirus 2 NEGATIVE NEGATIVE Final    Comment: (NOTE) SARS-CoV-2 target nucleic acids are NOT DETECTED.  The SARS-CoV-2 RNA is generally detectable in upper and lower respiratory specimens during the acute phase of infection. Negative results do not preclude SARS-CoV-2  infection, do not rule out co-infections with other pathogens, and should not be used as the sole basis for treatment or other patient management decisions. Negative results must be combined with clinical observations, patient history, and epidemiological information. The expected result is Negative.  Fact Sheet for Patients: HairSlick.no  Fact Sheet for Healthcare Providers: quierodirigir.com  This test is not yet approved or cleared by the [place] FDA and  has been authorized for detection and/or diagnosis of SARS-CoV-2 by FDA under an Emergency Use Authorization (EUA). This EUA will remain  in effect (meaning this test can be used) for the duration of the COVID-19 declaration under Se ction 564(b)(1) of the Act, 21 U.S.C. section 360bbb-3(b)(1), unless the authorization is terminated or revoked sooner.  Performed at [facility], [address], [place], [place] [address]      Patient was seen and examined on the day of discharge and was found to be in stable condition. Time coordinating discharge: 20 minutes including assessment and coordination of care, as well as examination of the patient.   SIGNED:  Noralee Stain, DO [facility] 07/03/2020, 12:06 PM

## 2020-07-03 NOTE — Tx Team (Signed)
Initial Treatment Plan 07/03/2020 6:09 PM [name] ZOX:[id number]  PATIENT STRESSORS: Patient denies having any specific stressor, stating that "It's just the stress of life. Nothing in particular." It was noted he was upset with his girlfriend prior to SI attempt.   PATIENT STRENGTHS: Ability for insight Physical Health Religious Affiliation   PATIENT IDENTIFIED PROBLEMS: Depression and anxiety  History of substance abuse                   DISCHARGE CRITERIA:  Improved stabilization in mood, thinking, and/or behavior  PRELIMINARY DISCHARGE PLAN: Return to previous living arrangement  PATIENT/FAMILY INVOLVEMENT: This treatment plan has been presented to and reviewed with the patient, Stephen Davis. The patient has been given the opportunity to ask questions and make suggestions.  Celene Kras, RN 07/03/2020, 6:09 PM

## 2020-07-03 NOTE — TOC Transition Note (Signed)
Transition of Care [facility]) - CM/SW Discharge Note   Patient Details  Name: Ante Arredondo MRN: [id number] Date of Birth: 1995-09-18  Transition of Care [facility] CM/SW Contact:  Ida Rogue, LCSW Phone Number: 07/03/2020, 12:13 PM   Clinical Narrative:   Per Jerilynn Som at [facility], patient can transfer today at [facility].  MD alerted. Mr Frimpong alerted.  Sheriff called for transport.  Nursing, please call report to number listed in Calvin's note. TOC sign off.    Final next level of care: Psychiatric Hospital Barriers to Discharge: Barriers Resolved   Patient Goals and CMS Choice        Discharge Placement                       Discharge Plan and Services In-house Referral: Clinical Social Work                                   Social Determinants of Health (SDOH) Interventions     Readmission Risk Interventions No flowsheet data found.

## 2020-07-03 NOTE — Progress Notes (Signed)
Patient was calm and cooperative with the admission process. His affect is flat and he states "I don't think I need to be here." He states he tried to OD by crushing up a bottle of Benadryl. However, he states, "It was a mistake. I would never do that again in my life. I read the Bible." Pt denies HI and AVH.   Pt states he has a history of smoking 1.5 packs of cigarettes daily, but that he quit 15 days ago. He states he does not need a nicotine patch and has been refusing it upstairs. Pt also states he used to do acid, but he stopped after it gave him a seizure when mixed with alcohol. Pt states he has only had occasional alcohol use and denies recent alcohol use.  Pt is not observed to be in any distress and denies any pain. Pt has been isolative to his room since coming onto the unit. Pt remains safe on the unit at this time and q15 minute safety checks are maintained.

## 2020-07-03 NOTE — BH Assessment (Signed)
Patient has been accepted to [facility].  Accepting physician is Dr. Neale Burly.  Attending  Physician will be Dr.Freeman.  Patient has been assigned to room 303, by [facility] Charge Nurse Boron.   Call report to [phone].  Representative/Transfer Coordinator is Warden/ranger Patient pre-admitted by [facility] Patient Access ([facility])  [facility] Staff Cleone Slim, SW) made aware of acceptance.

## 2020-07-03 NOTE — [facility] Group Notes (Signed)
[facility] Group Notes:  (Nursing/MHT/Case Management/Adjunct)  Date:  07/03/2020  Time:  8:57 PM  Type of Therapy:  Group Therapy  Participation Level:  Active  Participation Quality:  Appropriate  Affect:  Appropriate  Cognitive:  Alert  Insight:  Good  Engagement in Group:  Engaged and said he want to leave and while he here he is reading and not having no negative thought.  Modes of Intervention:  Support  Summary of Progress/Problems:  Mayra Neer 07/03/2020, 8:57 PM

## 2020-07-03 NOTE — Progress Notes (Addendum)
   [facility]    [address] [place], [place] [address] Phone: [phone]    Fax:  [phone]   July 03, 2020    Patient: Stephen Davis  MRN: [id number]  Date of Birth: 24-May-1995   To Whom it May Concern:   The above patient was discharged from [facility] on 07/03/2020 and admitted to [facility] on the same day. We do not anticipate the patient will be released on or before 07/08/2020.   If you have questions, please do not hesitate to contact his staff.   Sincerely,  Merlyn Albert, RN

## 2020-07-04 DIAGNOSIS — F314 Bipolar disorder, current episode depressed, severe, without psychotic features: Principal | ICD-10-CM

## 2020-07-04 LAB — URINALYSIS, COMPLETE (UACMP) WITH MICROSCOPIC
Bacteria, UA: NONE SEEN
Bilirubin Urine: NEGATIVE
Glucose, UA: NEGATIVE mg/dL
Hgb urine dipstick: NEGATIVE
Ketones, ur: NEGATIVE mg/dL
Leukocytes,Ua: NEGATIVE
Nitrite: NEGATIVE
Protein, ur: NEGATIVE mg/dL
Specific Gravity, Urine: 1.008 (ref 1.005–1.030)
Squamous Epithelial / HPF: NONE SEEN (ref 0–5)
pH: 5 (ref 5.0–8.0)

## 2020-07-04 LAB — LIPID PANEL
Cholesterol: 129 mg/dL (ref 0–200)
HDL: 40 mg/dL — ABNORMAL LOW (ref >40)
LDL Cholesterol: 80 mg/dL (ref 0–99)
Total CHOL/HDL Ratio: 3.2 RATIO
Triglycerides: 46 mg/dL (ref <150)
VLDL: 9 mg/dL (ref 0–40)

## 2020-07-04 LAB — HEMOGLOBIN A1C
Hgb A1c MFr Bld: 4.9 % (ref 4.8–5.6)
Mean Plasma Glucose: 93.93 mg/dL

## 2020-07-04 MED ORDER — ARIPIPRAZOLE 5 MG PO TABS
5.0000 mg | ORAL_TABLET | Freq: Every day | ORAL | Status: DC
  Administered 2020-07-04 – 2020-07-06 (×3): 5 mg via ORAL
  Filled 2020-07-04 (×3): qty 1

## 2020-07-04 NOTE — BHH Counselor (Signed)
Adult Comprehensive Assessment  Patient ID: Stephen Davis, male   DOB: [birth year], 25 y.o.   MRN: [id number]   CSW attempted to meet with patient twice but was unable. At 11:38am, patient was getting ready to head to lunch. CSW attempted again at 1:05pm after the patient met with psychiatrist. [facility] Tech stated that he didn't call his mom back and was asleep and needed some "time".   This was patient's first attempt at [place].   CSW will attempt again on another day.  Trecia Rogers. 07/04/2020

## 2020-07-04 NOTE — H&P (Signed)
Psychiatric Admission Assessment Adult  Patient Identification: Stephen Davis MRN:  [id number] Date of Evaluation:  07/04/2020 Chief Complaint:  Bipolar disorder, current episode depressed, severe, without psychotic features (HCC) [F31.4] Principal Diagnosis: Bipolar disorder, current episode depressed, severe, without psychotic features (HCC) Diagnosis:  Principal Problem:   Bipolar disorder, current episode depressed, severe, without psychotic features (HCC) Active Problems:   Overdose  History of Present Illness: Patient seen and chart reviewed.  25 year old man transferred to [place] from [place].  He had been admitted to the hospital at the very end of January because of an intentional overdose on Benadryl.  Patient has admitted that this was a suicide attempt.  He is cooperative and pleasant in the interview today although still seems dysphoric.  He said that he had been feeling so stressed out and filled with anxiety and overwhelmed that it seemed like he should kill himself.  When trying to describe what it been overwhelming him a lot of it sounds very vague and somewhat paranoid.  He talks about fearing that he and his girlfriend were not going to be safe because he was such a small person.  The patient is in fact perfectly normal size and looks to be in reasonably good shape.  He seems to have a feeling that there is a reason for him to be paranoid that people are going to try to harm him although he cannot specify any particular reason for this.  Continues to feel paranoid now and the more 1 talks the more it seems to dominate his thinking.  Patient says that prior to the suicide attempt he had been taking medication but only hydroxyzine.  Had not really been following up with outpatient mental health care.  Today he says he is feeling okay.  He denies any current suicidal ideation.  Denies hallucinations.  Had noticed above he still has a paranoid cast to all of his thinking but seems more  hopeful about the future.  No physical complaints.  Says he has been sleeping and eating adequately. Associated Signs/Symptoms: Depression Symptoms:  depressed mood, difficulty concentrating, hopelessness, suicidal attempt, Duration of Depression Symptoms: No data recorded (Hypo) Manic Symptoms:  Impulsivity, Anxiety Symptoms:  Social Anxiety, Psychotic Symptoms:  Paranoia, Duration of Psychotic Symptoms: No data recorded PTSD Symptoms: Negative Total Time spent with patient: 1 hour  Past Psychiatric History: Little information in the written record but the patient confirms that he has had multiple hospitalizations in the past mostly at [facility].  Looks like this information was perhaps redirected from their record.  He confirms however that he has been on multiple medications in the past including lithium Abilify Seroquel hydroxyzine Adderall as a younger person.  He confirms that he has had suicide attempts in the past but says it was when he was much younger like about 35.  He denies any recent abuse of alcohol or drugs.  He seems to have some insight that treatment has been helpful and expresses a willingness to engage in treatment planning although his insight about his paranoia persists.  According to the old notes the mother has confirmed that the patient has chronic problems with paranoia and odd behavior that have made it difficult for him to function as an adult  Is the patient at risk to self? Yes.    Has the patient been a risk to self in the past 6 months? Yes.    Has the patient been a risk to self within the distant past? Yes.  Is the patient a risk to others? No.  Has the patient been a risk to others in the past 6 months? No.  Has the patient been a risk to others within the distant past? No.   Prior Inpatient Therapy:   Prior Outpatient Therapy:    Alcohol Screening: 1. How often do you have a drink containing alcohol?: Monthly or less 2. How many drinks containing alcohol  do you have on a typical day when you are drinking?: 1 or 2 3. How often do you have six or more drinks on one occasion?: Less than monthly AUDIT-C Score: 2 4. How often during the last year have you found that you were not able to stop drinking once you had started?: Never 5. How often during the last year have you failed to do what was normally expected from you because of drinking?: Never 6. How often during the last year have you needed a first drink in the morning to get yourself going after a heavy drinking session?: Never 7. How often during the last year have you had a feeling of guilt of remorse after drinking?: Never 8. How often during the last year have you been unable to remember what happened the night before because you had been drinking?: Never 9. Have you or someone else been injured as a result of your drinking?: No 10. Has a relative or friend or a doctor or another health worker been concerned about your drinking or suggested you cut down?: No Alcohol Use Disorder Identification Test Final Score (AUDIT): 2 Alcohol Brief Interventions/Follow-up: AUDIT Score <7 follow-up not indicated Substance Abuse History in the last 12 months:  No. Consequences of Substance Abuse: Negative Previous Psychotropic Medications: Yes  Psychological Evaluations: Yes  Past Medical History:  Past Medical History:  Diagnosis Date  . Schizophrenia (HCC)    History reviewed. No pertinent surgical history. Family History: History reviewed. No pertinent family history. Family Psychiatric  History: Mother reportedly said that the patient's biological father was depressed.  Details otherwise unclear. Tobacco Screening: Tobacco use, Select all that apply: 5 or more cigarettes per day (stopped 15 days ago per pt report) Are you interested in Tobacco Cessation Medications?: No, patient refused Counseled patient on smoking cessation including recognizing danger situations, developing coping skills and  basic information about quitting provided: Yes Social History:  Social History   Substance and Sexual Activity  Alcohol Use Not Currently     Social History   Substance and Sexual Activity  Drug Use Not Currently  . Types: LSD   Comment: States he has done LSD in the past few years but it caused a seizure, so he no longer takes it.    Additional Social History:                           Allergies:  No Known Allergies Lab Results:  Results for orders placed or performed during the hospital encounter of 07/03/20 (from the past 48 hour(s))  Lipid panel     Status: Abnormal   Collection Time: 07/04/20  6:34 AM  Result Value Ref Range   Cholesterol 129 0 - 200 mg/dL   Triglycerides 46 <161 mg/dL   HDL 40 (L) >09 mg/dL   Total CHOL/HDL Ratio 3.2 RATIO   VLDL 9 0 - 40 mg/dL   LDL Cholesterol 80 0 - 99 mg/dL    Comment:        Total Cholesterol/HDL:CHD Risk Coronary  Heart Disease Risk Table                     Men   Women  1/2 Average Risk   3.4   3.3  Average Risk       5.0   4.4  2 X Average Risk   9.6   7.1  3 X Average Risk  23.4   11.0        Use the calculated Patient Ratio above and the CHD Risk Table to determine the patient's CHD Risk.        ATP III CLASSIFICATION (LDL):  <100     mg/dL   Optimal  [id number]  mg/dL   Near or Above                    Optimal  130-159  mg/dL   Borderline  [id number]  mg/dL   High  >956>190     mg/dL   Very High Performed at [facility], [address] [place], [place] [address]   Hemoglobin A1c     Status: None   Collection Time: 07/04/20  6:34 AM  Result Value Ref Range   Hgb A1c MFr Bld 4.9 4.8 - 5.6 %    Comment: (NOTE) Pre diabetes:          5.7%-6.4%  Diabetes:              >6.4%  Glycemic control for   <7.0% adults with diabetes    Mean Plasma Glucose 93.93 mg/dL    Comment: Performed at [facility], [address] [place], [place] [address]    Blood Alcohol level:  Lab Results  Component  Value Date   ETH 23 (H) 06/21/2020   ETH <10 05/01/2019    Metabolic Disorder Labs:  Lab Results  Component Value Date   HGBA1C 4.9 07/04/2020   MPG 93.93 07/04/2020   No results found for: PROLACTIN Lab Results  Component Value Date   CHOL 129 07/04/2020   TRIG 46 07/04/2020   HDL 40 (L) 07/04/2020   CHOLHDL 3.2 07/04/2020   VLDL 9 07/04/2020   LDLCALC 80 07/04/2020    Current Medications: Current Facility-Administered Medications  Medication Dose Route Frequency Provider Last Rate Last Admin  . acetaminophen (TYLENOL) tablet 650 mg  650 mg Oral Q6H PRN Jesse SansFreeman, Megan M, MD      . alum & mag hydroxide-simeth (MAALOX/MYLANTA) 200-200-20 MG/5ML suspension 30 mL  30 mL Oral Q4H PRN Jesse SansFreeman, Megan M, MD      . ARIPiprazole (ABILIFY) tablet 5 mg  5 mg Oral QHS Clapacs, John T, MD      . hydrOXYzine (ATARAX/VISTARIL) tablet 25 mg  25 mg Oral Q4H PRN Jesse SansFreeman, Megan M, MD      . magnesium hydroxide (MILK OF MAGNESIA) suspension 30 mL  30 mL Oral Daily PRN Jesse SansFreeman, Megan M, MD      . nicotine (NICODERM CQ - dosed in mg/24 hours) patch 14 mg  14 mg Transdermal Daily Jesse SansFreeman, Megan M, MD      . senna [facility](SENOKOT) tablet 8.6 mg  1 tablet Oral QHS Jesse SansFreeman, Megan M, MD   8.6 mg at 07/03/20 2142  . traZODone (DESYREL) tablet 50 mg  50 mg Oral QHS PRN Jesse SansFreeman, Megan M, MD   50 mg at 07/03/20 2142   PTA Medications: Medications Prior to Admission  Medication Sig Dispense Refill Last Dose  . risperiDONE (RISPERDAL) 1  MG tablet Take 1 tablet (1 mg total) by mouth at bedtime.       Musculoskeletal: Strength & Muscle Tone: within normal limits Gait & Station: normal Patient leans: N/A  Psychiatric Specialty Exam: Physical Exam Vitals and nursing note reviewed.  Constitutional:      Appearance: He is well-developed and well-nourished.  HENT:     Head: Normocephalic and atraumatic.  Eyes:     Conjunctiva/sclera: Conjunctivae normal.     Pupils: Pupils are equal, round, and reactive to  light.  Cardiovascular:     Heart sounds: Normal heart sounds.  Pulmonary:     Effort: Pulmonary effort is normal.  Abdominal:     Palpations: Abdomen is soft.  Musculoskeletal:        General: Normal range of motion.     Cervical back: Normal range of motion.  Skin:    General: Skin is warm and dry.  Neurological:     General: No focal deficit present.     Mental Status: He is alert.  Psychiatric:        Attention and Perception: Attention normal.        Mood and Affect: Mood is depressed. Affect is blunt.        Speech: Speech normal.        Behavior: Behavior is slowed.        Thought Content: Thought content is paranoid. Thought content does not include homicidal or suicidal ideation.        Cognition and Memory: Cognition normal.        Judgment: Judgment is impulsive.     Review of Systems  Constitutional: Negative.   HENT: Negative.   Eyes: Negative.   Respiratory: Negative.   Cardiovascular: Negative.   Gastrointestinal: Negative.   Musculoskeletal: Negative.   Skin: Negative.   Neurological: Negative.   Psychiatric/Behavioral: Positive for dysphoric mood. The patient is nervous/anxious.     Blood pressure 124/88, pulse 76, temperature 98.5 F (36.9 C), temperature source Oral, resp. rate 18, height 5\' 8"  (1.727 m), weight 64.4 kg, SpO2 100 %.Body mass index is 21.59 kg/m.  General Appearance: Casual  Eye Contact:  Minimal  Speech:  Slow  Volume:  Decreased  Mood:  Depressed  Affect:  Blunt  Thought Process:  Coherent  Orientation:  Full (Time, Place, and Person)  Thought Content:  Paranoid Ideation  Suicidal Thoughts:  No  Homicidal Thoughts:  No  Memory:  Immediate;   Fair Recent;   Fair Remote;   Fair  Judgement:  Impaired  Insight:  Shallow  Psychomotor Activity:  Decreased  Concentration:  Concentration: Fair  Recall:  of Knowledge:  Fair  Language:  Fair  Akathisia:  No  Handed:  Right  AIMS (if indicated):     Assets:  Desire  for Improvement Physical Health Resilience Social Support  ADL's:  Impaired  Cognition:  WNL  Sleep:  Number of Hours: 9    Treatment Plan Summary: Medication management and Plan Patient superficially is cognitively intact pleasant lucid.  Admits to recent suicide attempt but denies current suicidal ideation.  On further conversation he clearly still harbors a lot of paranoid thinking.  Differential diagnosis still a little unclear but includes the previously mentioned schizoaffective disorder with depression or recurrent psychotic depression or perhaps a more paranoid personality disorder with depression.  Patient is cooperative and engaged in discussing medicine management.  I proposed to him that we discontinue the currently prescribed Risperdal and replace it  with Abilify as perhaps a better fit for depression.  Patient says he actually prefers that medicine and is glad to do it.  Order changed to 5 mg of Abilify.  Staff will evaluate the patient and he will be engaged in appropriate treatment on the unit with ongoing evaluation of symptoms prior to discharge planning.  Observation Level/Precautions:  15 minute checks  Laboratory:  Chemistry Profile  Psychotherapy:    Medications:    Consultations:    Discharge Concerns:    Estimated LOS:  Other:     Physician Treatment Plan for Primary Diagnosis: Bipolar disorder, current episode depressed, severe, without psychotic features (HCC) Long Term Goal(s): Improvement in symptoms so as ready for discharge  Short Term Goals: Ability to verbalize feelings will improve, Ability to identify and develop effective coping behaviors will improve and Compliance with prescribed medications will improve  Physician Treatment Plan for Secondary Diagnosis: Principal Problem:   Bipolar disorder, current episode depressed, severe, without psychotic features (HCC) Active Problems:   Overdose  Long Term Goal(s): Improvement in symptoms so as ready for  discharge  Short Term Goals: Ability to maintain clinical measurements within normal limits will improve  I certify that inpatient services furnished can reasonably be expected to improve the patient's condition.    Mordecai Rasmussen, MD 2/12/202212:38 PM

## 2020-07-04 NOTE — Progress Notes (Signed)
Patient alert and oriented x 4, affect is flat , he forwards very little, he appears upset to be here on the unit. He stated " I don't mean to be arrogant but there are worse people here l need to be else where"  Patient was reassured that everyone situation is different and his discharge was based on the MD's decision. Patient was offered emotional support and encouragement, he was given scheduled night medication, 15 minutes safety checks maintained .

## 2020-07-04 NOTE — BHH Suicide Risk Assessment (Signed)
[facility] Admission Suicide Risk Assessment   Nursing information obtained from:  Patient Demographic factors:  Male,Adolescent or young adult,Unemployed Current Mental Status:  NA Loss Factors:  NA Historical Factors:  Prior suicide attempts,Impulsivity Risk Reduction Factors:  Positive social support  Total Time spent with patient: 1 hour Principal Problem: Bipolar disorder, current episode depressed, severe, without psychotic features (HCC) Diagnosis:  Principal Problem:   Bipolar disorder, current episode depressed, severe, without psychotic features (HCC) Active Problems:   Overdose  Subjective Data: 25 year old man transferred to [place] from [place].  He had been in the hospital since the last days of January when he had been admitted with a overdose of Benadryl.  Patient has admitted that this was a suicide attempt.  Patient today continues to voice some anxiety and dysphoria but denies any current suicidal thoughts or intent.  Cooperative with treatment.  Partially insightful.  No new physical complaints.  Continued Clinical Symptoms:  Alcohol Use Disorder Identification Test Final Score (AUDIT): 2 The "Alcohol Use Disorders Identification Test", Guidelines for Use in Primary Care, Second Edition.  World Science writer [facility]). Score between 0-7:  no or low risk or alcohol related problems. Score between 8-15:  moderate risk of alcohol related problems. Score between 16-19:  high risk of alcohol related problems. Score 20 or above:  warrants further diagnostic evaluation for alcohol dependence and treatment.   CLINICAL FACTORS:   Bipolar Disorder:   Mixed State   Musculoskeletal: Strength & Muscle Tone: within normal limits Gait & Station: normal Patient leans: N/A  Psychiatric Specialty Exam: Physical Exam Vitals and nursing note reviewed.  Constitutional:      Appearance: He is well-developed and well-nourished.  HENT:     Head: Normocephalic and atraumatic.  Eyes:      Conjunctiva/sclera: Conjunctivae normal.     Pupils: Pupils are equal, round, and reactive to light.  Cardiovascular:     Heart sounds: Normal heart sounds.  Pulmonary:     Effort: Pulmonary effort is normal.  Abdominal:     Palpations: Abdomen is soft.  Musculoskeletal:        General: Normal range of motion.     Cervical back: Normal range of motion.  Skin:    General: Skin is warm and dry.  Neurological:     General: No focal deficit present.     Mental Status: He is alert.  Psychiatric:        Attention and Perception: Attention normal.        Mood and Affect: Mood is depressed. Affect is blunt.        Speech: Speech normal.        Behavior: Behavior is slowed.        Thought Content: Thought content is paranoid. Thought content does not include homicidal or suicidal ideation.        Cognition and Memory: Cognition normal.        Judgment: Judgment is impulsive.     Review of Systems  Constitutional: Negative.   HENT: Negative.   Eyes: Negative.   Respiratory: Negative.   Cardiovascular: Negative.   Gastrointestinal: Negative.   Musculoskeletal: Negative.   Skin: Negative.   Neurological: Negative.   Psychiatric/Behavioral: Positive for dysphoric mood. The patient is nervous/anxious.     Blood pressure 124/88, pulse 76, temperature 98.5 F (36.9 C), temperature source Oral, resp. rate 18, height 5\' 8"  (1.727 m), weight 64.4 kg, SpO2 100 %.Body mass index is 21.59 kg/m.  General Appearance: Fairly Groomed  Eye  Contact:  Fair  Speech:  Slow  Volume:  Decreased  Mood:  Dysphoric  Affect:  Congruent  Thought Process:  Coherent  Orientation:  Full (Time, Place, and Person)  Thought Content:  Paranoid Ideation  Suicidal Thoughts:  No  Homicidal Thoughts:  No  Memory:  Immediate;   Fair Recent;   Fair Remote;   Fair  Judgement:  Impaired  Insight:  Shallow  Psychomotor Activity:  Normal  Concentration:  Concentration: Fair  Recall:  [employer] of  Knowledge:  Fair  Language:  Fair  Akathisia:  No  Handed:  Right  AIMS (if indicated):     Assets:  Desire for Improvement Physical Health Resilience Social Support  ADL's:  Intact  Cognition:  WNL  Sleep:  Number of Hours: 9      COGNITIVE FEATURES THAT CONTRIBUTE TO RISK:  Thought constriction (tunnel vision)    SUICIDE RISK:   Mild:  Suicidal ideation of limited frequency, intensity, duration, and specificity.  There are no identifiable plans, no associated intent, mild dysphoria and related symptoms, good self-control (both objective and subjective assessment), few other risk factors, and identifiable protective factors, including available and accessible social support.  PLAN OF CARE: Continue 15-minute checks.  Engage in individual and group assessment and ongoing therapy on the unit.  Suggested some medication changes.  Daily monitoring of mental state including suicidal thoughts prior to discharge planning  I certify that inpatient services furnished can reasonably be expected to improve the patient's condition.   Mordecai Rasmussen, MD 07/04/2020, 12:35 PM

## 2020-07-04 NOTE — Progress Notes (Signed)
Pt A & O to self, place and situation. Presents with flat affect and depressed mood,  pleasant and brightens up on interactions. Denies SI, HI, AVH and pain when assessed. Declined his nicotine patch when offered "I don't need it". Rates his anxiety, depression and hopelessness all 0/10. Pt's goal for today is to "Reach out to family". Denies SI, HI, AVH and pain at this time.  Pt attended scheduled group, engaged in activities and interacts well with others.  Emotional support and encouragement provided to pt. Q 15 minutes safety checks maintained without self harm gestures. All medications given as ordered with verbal education and effects monitored. Writer notified pt of order for urine sample for U/A and specimen cup given.  Pt receptive to care. Denies concerns thus far. Safety maintained on unit.

## 2020-07-05 DIAGNOSIS — F314 Bipolar disorder, current episode depressed, severe, without psychotic features: Secondary | ICD-10-CM | POA: Diagnosis not present

## 2020-07-05 NOTE — Progress Notes (Signed)
Patient is alert and cooperative.  He denies SI  HI   AVH depression anxiety and pain at this encounter. His speech logical, clear and coherent.  He is med compliant and tolerated his meds without incident. Had questions regarding if he had to take his stool softner and reports no longer needing. He is safe on the unit with 15 minute safety checks and encouraged to come to staff if he had any concerns.    Cleo Butler-Nicholson, LPN

## 2020-07-05 NOTE — Progress Notes (Signed)
[facility]  07/05/2020 12:34 PM Stephen Davis  MRN:  [id number] Subjective: Follow-up for this young man with psychotic disorder.  Patient seen chart reviewed.  He tells me he slept well.  Has no physical complaints today.  He tells me that he much prefers taking Abilify 2 other psychiatric medicine.  Patient has been mostly to himself but is engageable and is taking care of his hygiene well.  No threats and denies any current suicidal ideation.  Still seems somewhat paranoid but working to overcome it Principal Problem: Bipolar disorder, current episode depressed, severe, without psychotic features (HCC) Diagnosis: Principal Problem:   Bipolar disorder, current episode depressed, severe, without psychotic features (HCC) Active Problems:   Overdose  Total Time spent with patient: 30 minutes  Past Psychiatric History: Past history of recurrent paranoid psychosis  Past Medical History:  Past Medical History:  Diagnosis Date  . Schizophrenia (HCC)    History reviewed. No pertinent surgical history. Family History: History reviewed. No pertinent family history. Family Psychiatric  History: See previous Social History:  Social History   Substance and Sexual Activity  Alcohol Use Not Currently     Social History   Substance and Sexual Activity  Drug Use Not Currently  . Types: LSD   Comment: States he has done LSD in the past few years but it caused a seizure, so he no longer takes it.    Social History   Socioeconomic History  . Marital status: Single    Spouse name: Not on file  . Number of children: Not on file  . Years of education: Not on file  . Highest education level: Not on file  Occupational History  . Not on file  Tobacco Use  . Smoking status: Former Smoker    Packs/day: 1.50    Types: Cigarettes    Quit date: 06/19/2020    Years since quitting: 0.0  . Smokeless tobacco: Never Used  Substance and Sexual Activity  . Alcohol use: Not Currently  . Drug  use: Not Currently    Types: LSD    Comment: States he has done LSD in the past few years but it caused a seizure, so he no longer takes it.  . Sexual activity: Not on file  Other Topics Concern  . Not on file  Social History Narrative  . Not on file   Social Determinants of Health   Financial Resource Strain: Not on file  Food Insecurity: Not on file  Transportation Needs: Not on file  Physical Activity: Not on file  Stress: Not on file  Social Connections: Not on file   Additional Social History:                         Sleep: Fair  Appetite:  Fair  Current Medications: Current Facility-Administered Medications  Medication Dose Route Frequency Provider Last Rate Last Admin  . acetaminophen (TYLENOL) tablet 650 mg  650 mg Oral Q6H PRN Jesse SansFreeman, Megan M, MD      . alum & mag hydroxide-simeth (MAALOX/MYLANTA) 200-200-20 MG/5ML suspension 30 mL  30 mL Oral Q4H PRN Jesse SansFreeman, Megan M, MD      . ARIPiprazole (ABILIFY) tablet 5 mg  5 mg Oral QHS Tamecia Mcdougald, Jackquline DenmarkJohn T, MD   5 mg at 07/04/20 2203  . hydrOXYzine (ATARAX/VISTARIL) tablet 25 mg  25 mg Oral Q4H PRN Jesse SansFreeman, Megan M, MD      . magnesium hydroxide (MILK OF MAGNESIA) suspension 30  mL  30 mL Oral Daily PRN Jesse Sans, MD      . nicotine (NICODERM CQ - dosed in mg/24 hours) patch 14 mg  14 mg Transdermal Daily Jesse Sans, MD   14 mg at 07/05/20 0846  . senna (SENOKOT) tablet 8.6 mg  1 tablet Oral QHS Jesse Sans, MD   8.6 mg at 07/04/20 2203  . traZODone (DESYREL) tablet 50 mg  50 mg Oral QHS PRN Jesse Sans, MD   50 mg at 07/03/20 2142    Lab Results:  Results for orders placed or performed during the hospital encounter of 07/03/20 (from the past 48 hour(s))  Lipid panel     Status: Abnormal   Collection Time: 07/04/20  6:34 AM  Result Value Ref Range   Cholesterol 129 0 - 200 mg/dL   Triglycerides 46 <485 mg/dL   HDL 40 (L) >46 mg/dL   Total CHOL/HDL Ratio 3.2 RATIO   VLDL 9 0 - 40 mg/dL   LDL  Cholesterol 80 0 - 99 mg/dL    Comment:        Total Cholesterol/HDL:CHD Risk Coronary Heart Disease Risk Table                     Men   Women  1/2 Average Risk   3.4   3.3  Average Risk       5.0   4.4  2 X Average Risk   9.6   7.1  3 X Average Risk  23.4   11.0        Use the calculated Patient Ratio above and the CHD Risk Table to determine the patient's CHD Risk.        ATP III CLASSIFICATION (LDL):  <100     mg/dL   Optimal  270-350  mg/dL   Near or Above                    Optimal  130-159  mg/dL   Borderline  093-818  mg/dL   High  >299     mg/dL   Very High Performed at [facility], [address] [place], [place] [address]   Hemoglobin A1c     Status: None   Collection Time: 07/04/20  6:34 AM  Result Value Ref Range   Hgb A1c MFr Bld 4.9 4.8 - 5.6 %    Comment: (NOTE) Pre diabetes:          5.7%-6.4%  Diabetes:              >6.4%  Glycemic control for   <7.0% adults with diabetes    Mean Plasma Glucose 93.93 mg/dL    Comment: Performed at [facility], [address], [place], [place] [address]  Urinalysis, Complete w Microscopic Urine, Clean Catch     Status: Abnormal   Collection Time: 07/04/20 12:04 PM  Result Value Ref Range   Color, Urine STRAW (A) YELLOW   APPearance CLEAR (A) CLEAR   Specific Gravity, Urine 1.008 1.005 - 1.030   pH 5.0 5.0 - 8.0   Glucose, UA NEGATIVE NEGATIVE mg/dL   Hgb urine dipstick NEGATIVE NEGATIVE   Bilirubin Urine NEGATIVE NEGATIVE   Ketones, ur NEGATIVE NEGATIVE mg/dL   Protein, ur NEGATIVE NEGATIVE mg/dL   Nitrite NEGATIVE NEGATIVE   Leukocytes,Ua NEGATIVE NEGATIVE   RBC / HPF 0-5 0 - 5 RBC/hpf   WBC, UA 0-5 0 - 5 WBC/hpf  Bacteria, UA NONE SEEN NONE SEEN   Squamous Epithelial / LPF NONE SEEN 0 - 5    Comment: Performed at [facility], [address] [place], [place] [address]    Blood Alcohol level:  Lab Results  Component Value Date   ETH 23 (H) 06/21/2020   ETH <10 05/01/2019     Metabolic Disorder Labs: Lab Results  Component Value Date   HGBA1C 4.9 07/04/2020   MPG 93.93 07/04/2020   No results found for: PROLACTIN Lab Results  Component Value Date   CHOL 129 07/04/2020   TRIG 46 07/04/2020   HDL 40 (L) 07/04/2020   CHOLHDL 3.2 07/04/2020   VLDL 9 07/04/2020   LDLCALC 80 07/04/2020    Physical Findings: AIMS: Facial and Oral Movements Muscles of Facial Expression: None, normal Lips and Perioral Area: None, normal Jaw: None, normal Tongue: None, normal,Extremity Movements Upper (arms, wrists, hands, fingers): None, normal Lower (legs, knees, ankles, toes): None, normal, Trunk Movements Neck, shoulders, hips: None, normal, Overall Severity Severity of abnormal movements (highest score from questions above): None, normal Incapacitation due to abnormal movements: None, normal Patient's awareness of abnormal movements (rate only patient's report): No Awareness, Dental Status Current problems with teeth and/or dentures?: No Does patient usually wear dentures?: No  CIWA:    COWS:     Musculoskeletal: Strength & Muscle Tone: within normal limits Gait & Station: normal Patient leans: N/A  Psychiatric Specialty Exam: Physical Exam Vitals and nursing note reviewed.  Constitutional:      Appearance: He is well-developed and well-nourished.  HENT:     Head: Normocephalic and atraumatic.  Eyes:     Conjunctiva/sclera: Conjunctivae normal.     Pupils: Pupils are equal, round, and reactive to light.  Cardiovascular:     Heart sounds: Normal heart sounds.  Pulmonary:     Effort: Pulmonary effort is normal.  Abdominal:     Palpations: Abdomen is soft.  Musculoskeletal:        General: Normal range of motion.     Cervical back: Normal range of motion.  Skin:    General: Skin is warm and dry.  Neurological:     Mental Status: He is alert.  Psychiatric:        Attention and Perception: Attention normal.        Mood and Affect: Affect is  blunt.        Speech: Speech normal.        Behavior: Behavior is cooperative.        Thought Content: Thought content is paranoid. Thought content does not include homicidal or suicidal ideation.        Cognition and Memory: Cognition is impaired.        Judgment: Judgment normal.     Review of Systems  Constitutional: Negative.   HENT: Negative.   Eyes: Negative.   Respiratory: Negative.   Cardiovascular: Negative.   Gastrointestinal: Negative.   Musculoskeletal: Negative.   Skin: Negative.   Neurological: Negative.   Psychiatric/Behavioral: Negative.     Blood pressure 119/74, pulse 74, temperature 98.7 F (37.1 C), temperature source Oral, resp. rate 18, height 5\' 8"  (1.727 m), weight 64.4 kg, SpO2 96 %.Body mass index is 21.59 kg/m.  General Appearance: Casual  Eye Contact:  Good  Speech:  Clear and Coherent  Volume:  Normal  Mood:  Euthymic  Affect:  Constricted  Thought Process:  Coherent  Orientation:  Full (Time, Place, and Person)  Thought Content:  Logical  and Paranoid Ideation  Suicidal Thoughts:  No  Homicidal Thoughts:  No  Memory:  Immediate;   Fair Recent;   Fair Remote;   Fair  Judgement:  Fair  Insight:  Fair  Psychomotor Activity:  Normal  Concentration:  Concentration: Fair  Recall:  [employer] of Knowledge:  Fair  Language:  Fair  Akathisia:  No  Handed:  Right  AIMS (if indicated):     Assets:  Desire for Improvement Housing Physical Health Resilience Social Support  ADL's:  Impaired  Cognition:  WNL  Sleep:  Number of Hours: 5.45     Treatment Plan Summary: Medication management and Plan Patient appears stable.  Cooperative.  Pleasant.  No acute suicidal thought or intent.  Safe in his behavior.  Cooperative with medicine.  No change to current medication orders.  Encourage participation and interaction on the unit  Mordecai Rasmussen, MD 07/05/2020, 12:34 PM
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
## 2020-07-05 NOTE — Plan of Care (Signed)
  Problem: Education: Goal: Knowledge of [place] [facility] Education information/materials will improve Outcome: Progressing Goal: Emotional status will improve Outcome: Progressing Goal: Mental status will improve Outcome: Progressing Goal: Verbalization of understanding the information provided will improve Outcome: Progressing   Problem: Activity: Goal: Interest or engagement in activities will improve Outcome: Progressing Goal: Sleeping patterns will improve Outcome: Progressing   Problem: Coping: Goal: Ability to verbalize frustrations and anger appropriately will improve Outcome: Progressing Goal: Ability to demonstrate self-control will improve Outcome: Progressing   Problem: Health Behavior/Discharge Planning: Goal: Identification of resources available to assist in meeting health care needs will improve Outcome: Progressing Goal: Compliance with treatment plan for underlying cause of condition will improve Outcome: Progressing   Problem: Physical Regulation: Goal: Ability to maintain clinical measurements within normal limits will improve Outcome: Progressing   Problem: Safety: Goal: Periods of time without injury will increase Outcome: Progressing   Problem: Education: Goal: Ability to make informed decisions regarding treatment will improve Outcome: Progressing   Problem: Coping: Goal: Coping ability will improve Outcome: Progressing   Problem: Health Behavior/Discharge Planning: Goal: Identification of resources available to assist in meeting health care needs will improve Outcome: Progressing   Problem: Medication: Goal: Compliance with prescribed medication regimen will improve Outcome: Progressing   Problem: Self-Concept: Goal: Ability to disclose and discuss suicidal ideas will improve Outcome: Progressing Goal: Will verbalize positive feelings about self Outcome: Progressing   Problem: Education: Goal: Utilization of techniques to improve  thought processes will improve Outcome: Progressing Goal: Knowledge of the prescribed therapeutic regimen will improve Outcome: Progressing   Problem: Activity: Goal: Interest or engagement in leisure activities will improve Outcome: Progressing Goal: Imbalance in normal sleep/wake cycle will improve Outcome: Progressing   Problem: Coping: Goal: Coping ability will improve Outcome: Progressing Goal: Will verbalize feelings Outcome: Progressing   Problem: Health Behavior/Discharge Planning: Goal: Ability to make decisions will improve Outcome: Progressing Goal: Compliance with therapeutic regimen will improve Outcome: Progressing   Problem: Role Relationship: Goal: Will demonstrate positive changes in social behaviors and relationships Outcome: Progressing   Problem: Safety: Goal: Ability to disclose and discuss suicidal ideas will improve Outcome: Progressing Goal: Ability to identify and utilize support systems that promote safety will improve Outcome: Progressing   Problem: Self-Concept: Goal: Will verbalize positive feelings about self Outcome: Progressing Goal: Level of anxiety will decrease Outcome: Progressing   

## 2020-07-05 NOTE — [facility] Group Notes (Signed)
[facility] Group Notes: (Clinical Social Work)   07/05/2020      Type of Therapy:  Group Therapy   Participation Level:  Did Not Attend - was invited individually by Nurse/MHT and chose not to attend.   Susa Simmonds, LCSWA 07/05/2020  2:26 PM

## 2020-07-05 NOTE — Plan of Care (Signed)
Patient has been active in the milieu and getting along with peers. Pleasant on approach and denying thoughts of self-harm. Thought process organized. Appetite is good.Patient reports sleeping well. Has no sign of distress. Support and encouragements provided. Safety monitored as recommended.

## 2020-07-05 NOTE — [facility] Suicide Risk Assessment (Signed)
[facility] INPATIENT:  Family/Significant Other Suicide Prevention Education  Suicide Prevention Education:  Patient Refusal for Family/Significant Other Suicide Prevention Education: The patient Stephen Davis has refused to provide written consent for family/significant other to be provided Family/Significant Other Suicide Prevention Education during admission and/or prior to discharge.  Physician notified.   CSW went over SPE information and provided patient with a brochure.   Carollee Herter  [place] 07/05/2020, 2:14 PM

## 2020-07-06 ENCOUNTER — Other Ambulatory Visit (HOSPITAL_COMMUNITY): Payer: Self-pay | Admitting: Behavioral Health

## 2020-07-06 DIAGNOSIS — F314 Bipolar disorder, current episode depressed, severe, without psychotic features: Secondary | ICD-10-CM | POA: Diagnosis not present

## 2020-07-06 DIAGNOSIS — F4310 Post-traumatic stress disorder, unspecified: Secondary | ICD-10-CM | POA: Diagnosis present

## 2020-07-06 MED ORDER — PRAZOSIN HCL 1 MG PO CAPS
1.0000 mg | ORAL_CAPSULE | Freq: Every day | ORAL | Status: DC
  Administered 2020-07-06: 1 mg via ORAL
  Filled 2020-07-06: qty 1

## 2020-07-06 MED ORDER — ARIPIPRAZOLE 5 MG PO TABS
5.0000 mg | ORAL_TABLET | Freq: Every day | ORAL | 0 refills | Status: DC
Start: 2020-07-06 — End: 2020-07-07

## 2020-07-06 MED ORDER — PRAZOSIN HCL 1 MG PO CAPS: 1.0000 mg | ORAL_CAPSULE | Freq: Every day | ORAL | 0 refills | Status: DC

## 2020-07-06 MED ORDER — TRAZODONE HCL 50 MG PO TABS: 50.0000 mg | ORAL_TABLET | Freq: Every evening | ORAL | 0 refills | Status: DC | PRN

## 2020-07-06 NOTE — [facility] Group Notes (Signed)
LCSW Group Therapy Note     07/06/2020 2:31 PM     Type of Therapy and Topic:  Group Therapy:  Overcoming Obstacles     Participation Level:  Did Not Attend     Description of Group:     In this group patients will be encouraged to explore what they see as obstacles to their own wellness and recovery. They will be guided to discuss their thoughts, feelings, and behaviors related to these obstacles. The group will process together ways to cope with barriers, with attention given to specific choices patients can make. Each patient will be challenged to identify changes they are motivated to make in order to overcome their obstacles. This group will be process-oriented, with patients participating in exploration of their own experiences as well as giving and receiving support and challenge from other group members.     Therapeutic Goals:  1.    Patient will identify personal and current obstacles as they relate to admission.  2.    Patient will identify barriers that currently interfere with their wellness or overcoming obstacles.  3.    Patient will identify feelings, thought process and behaviors related to these barriers.  4.    Patient will identify two changes they are willing to make to overcome these obstacles:        Summary of Patient Progress: X   Therapeutic Modalities:    Cognitive Behavioral Therapy  Solution Focused Therapy  Motivational Interviewing  Relapse Prevention Therapy     Stephen Davis, MSW, LCSW-A  07/06/2020 2:31 PM

## 2020-07-06 NOTE — Progress Notes (Signed)
Patient is alert and cooperative.  Easy to engage with clear and logical speech and thought.  He is active on the unit watching the Super Bowl this evening and engaging well with other patients on the unit. He is med compliant and received his QHS medication without incident.  He was provided a journal after speaking with him about ways to express feelings and thoughts.  He is safe on the unit with 15 minute safety checks and encouraged  To come to staff with any concerns.     Cleo Butler-Nicholson, LPN

## 2020-07-06 NOTE — B[facility] Counselor (Signed)
CSW spoke with the patient on his aftercare plan.  Patient declined aftercare referrals at this time stating that he was moving to [place].  Penni Homans, MSW, LCSW 07/06/2020 2:47 PM

## 2020-07-06 NOTE — Tx Team (Signed)
Interdisciplinary Treatment and Diagnostic Plan Update  07/06/2020 Time of Session: 09:00 Stephen Davis MRN: [id number]  Principal Diagnosis: Bipolar disorder, current episode depressed, severe, without psychotic features ([place])  Secondary Diagnoses: Principal Problem:   Bipolar disorder, current episode depressed, severe, without psychotic features ([place]) Active Problems:   Overdose   Current Medications:  Current Facility-Administered Medications  Medication Dose Route Frequency Provider Last Rate Last Admin  . acetaminophen (TYLENOL) tablet 650 mg  650 mg Oral Q6H PRN Salley Scarlet, MD   650 mg at 07/06/20 0645  . alum & mag hydroxide-simeth (MAALOX/MYLANTA) 200-200-20 MG/5ML suspension 30 mL  30 mL Oral Q4H PRN Salley Scarlet, MD      . ARIPiprazole (ABILIFY) tablet 5 mg  5 mg Oral QHS Clapacs, John T, MD   5 mg at 07/05/20 2110  . hydrOXYzine (ATARAX/VISTARIL) tablet 25 mg  25 mg Oral Q4H PRN Salley Scarlet, MD      . magnesium hydroxide (MILK OF MAGNESIA) suspension 30 mL  30 mL Oral Daily PRN Salley Scarlet, MD      . nicotine (NICODERM CQ - dosed in mg/24 hours) patch 14 mg  14 mg Transdermal Daily Salley Scarlet, MD   14 mg at 07/06/20 0830  . senna (SENOKOT) tablet 8.6 mg  1 tablet Oral QHS Salley Scarlet, MD   8.6 mg at 07/04/20 2203  . traZODone (DESYREL) tablet 50 mg  50 mg Oral QHS PRN Salley Scarlet, MD   50 mg at 07/05/20 2110   PTA Medications: Medications Prior to Admission  Medication Sig Dispense Refill Last Dose  . risperiDONE (RISPERDAL) 1 MG tablet Take 1 tablet (1 mg total) by mouth at bedtime.       Patient Stressors:    Patient Strengths: Ability for insight Physical Health Religious Affiliation  Treatment Modalities: Medication Management, Group therapy, Case management,  1 to 1 session with clinician, Psychoeducation, Recreational therapy.   Physician Treatment Plan for Primary Diagnosis: Bipolar disorder, current episode depressed,  severe, without psychotic features ([place]) Long Term Goal(s): Improvement in symptoms so as ready for discharge Improvement in symptoms so as ready for discharge   Short Term Goals: Ability to verbalize feelings will improve Ability to identify and develop effective coping behaviors will improve Compliance with prescribed medications will improve Ability to maintain clinical measurements within normal limits will improve  Medication Management: Evaluate patient's response, side effects, and tolerance of medication regimen.  Therapeutic Interventions: 1 to 1 sessions, Unit Group sessions and Medication administration.  Evaluation of Outcomes: Not Met  Physician Treatment Plan for Secondary Diagnosis: Principal Problem:   Bipolar disorder, current episode depressed, severe, without psychotic features ([place]) Active Problems:   Overdose  Long Term Goal(s): Improvement in symptoms so as ready for discharge Improvement in symptoms so as ready for discharge   Short Term Goals: Ability to verbalize feelings will improve Ability to identify and develop effective coping behaviors will improve Compliance with prescribed medications will improve Ability to maintain clinical measurements within normal limits will improve     Medication Management: Evaluate patient's response, side effects, and tolerance of medication regimen.  Therapeutic Interventions: 1 to 1 sessions, Unit Group sessions and Medication administration.  Evaluation of Outcomes: Not Met   RN Treatment Plan for Primary Diagnosis: Bipolar disorder, current episode depressed, severe, without psychotic features ([place]) Long Term Goal(s): Knowledge of disease and therapeutic regimen to maintain health will improve  Short Term Goals: Ability to remain free from  injury will improve, Ability to verbalize frustration and anger appropriately will improve, Ability to demonstrate self-control, Ability to participate in decision making will  improve, Ability to verbalize feelings will improve, Ability to disclose and discuss suicidal ideas, Ability to identify and develop effective coping behaviors will improve and Compliance with prescribed medications will improve  Medication Management: RN will administer medications as ordered by provider, will assess and evaluate patient's response and provide education to patient for prescribed medication. RN will report any adverse and/or side effects to prescribing provider.  Therapeutic Interventions: 1 on 1 counseling sessions, Psychoeducation, Medication administration, Evaluate responses to treatment, Monitor vital signs and CBGs as ordered, Perform/monitor CIWA, COWS, AIMS and Fall Risk screenings as ordered, Perform wound care treatments as ordered.  Evaluation of Outcomes: Not Met   LCSW Treatment Plan for Primary Diagnosis: Bipolar disorder, current episode depressed, severe, without psychotic features ([place]) Long Term Goal(s): Safe transition to appropriate next level of care at discharge, Engage patient in therapeutic group addressing interpersonal concerns.  Short Term Goals: Engage patient in aftercare planning with referrals and resources, Increase social support, Increase ability to appropriately verbalize feelings, Increase emotional regulation, Facilitate acceptance of mental health diagnosis and concerns, Identify triggers associated with mental health/substance abuse issues and Increase skills for wellness and recovery  Therapeutic Interventions: Assess for all discharge needs, 1 to 1 time with Social worker, Explore available resources and support systems, Assess for adequacy in community support network, Educate family and significant other(s) on suicide prevention, Complete Psychosocial Assessment, Interpersonal group therapy.  Evaluation of Outcomes: Not Met   Progress in Treatment: Attending groups: No. Participating in groups: No. Taking medication as prescribed:  Yes. Toleration medication: Yes. Family/Significant other contact made: No, will contact:  pt declined collateral/familial contact, SPE completed with pt. Patient understands diagnosis: Yes. Discussing patient identified problems/goals with staff: Yes. Medical problems stabilized or resolved: Yes. Denies suicidal/homicidal ideation: Yes. Issues/concerns per patient self-inventory: No. Other: None  New problem(s) identified: No, Describe:  none.  New Short Term/Long Term Goal(s): detox, elimination of symptoms of psychosis, medication management for mood stabilization; elimination of SI thoughts; development of comprehensive mental wellness plan.  Patient Goals: "Apply for some type of SSI."   Discharge Plan or Barriers: CSW will assist pt with development of aftercare/discharge plans.  Reason for Continuation of Hospitalization: Anxiety Medication stabilization  Estimated Length of Stay: 1-7 days  Attendees: Patient: Stephen Davis 07/06/2020 9:52 AM  Physician: Selina Cooley, MD 07/06/2020 9:52 AM  Nursing: West Pugh, RN 07/06/2020 9:52 AM  RN Care Manager: 07/06/2020 9:52 AM  Social Worker: Chalmers Guest. Guerry Bruin, MSW, [place], [place] 07/06/2020 9:52 AM  Recreational Therapist:  07/06/2020 9:52 AM  Other: Assunta Curtis, MSW, LCSW 07/06/2020 9:52 AM  Other: Kiva Martinique, MSW, LCSW-A 07/06/2020 9:52 AM  Other: 07/06/2020 9:52 AM    Scribe for Treatment Team: Shirl Harris, LCSW 07/06/2020 9:52 AM

## 2020-07-06 NOTE — Plan of Care (Signed)
Patient stated his anxiety and depression 0/10. Patient states " with my paranoia I cannot drive on high [address] or I cannot function normal." Patient stated that his goal is to be on the right medication.Denies SI,HI and AVH. Patient appropriate with staff & peers.Minimal interactions with peers.Appetite and energy level good. Did not attend groups. Support and encouragement given.
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
## 2020-07-06 NOTE — [facility] Group Notes (Signed)
[facility] Group Notes:  (Nursing/MHT/Case Management/Adjunct)  Date:  07/06/2020  Time:  8:57 PM  Type of Therapy:  Group Therapy  Participation Level:  Active  Participation Quality:  Appropriate  Affect:  Appropriate  Cognitive:  Alert  Insight:  Good  Engagement in Group:  Engaged and had to leave out of group and go to bathroom.  Modes of Intervention:  Support  Summary of Progress/Problems:  Stephen Davis 07/06/2020, 8:57 PM

## 2020-07-06 NOTE — Progress Notes (Signed)
Patient has c/o headache rated 5/10 this morning. Medication requested and received to help with symptoms.    Cleo Butler-Nicholson, LPN

## 2020-07-06 NOTE — Plan of Care (Signed)
  Problem: Education: Goal: Knowledge of [place] [facility] Education information/materials will improve Outcome: Progressing Goal: Emotional status will improve Outcome: Progressing Goal: Mental status will improve Outcome: Progressing Goal: Verbalization of understanding the information provided will improve Outcome: Progressing   Problem: Activity: Goal: Interest or engagement in activities will improve Outcome: Progressing Goal: Sleeping patterns will improve Outcome: Progressing   Problem: Coping: Goal: Ability to verbalize frustrations and anger appropriately will improve Outcome: Progressing Goal: Ability to demonstrate self-control will improve Outcome: Progressing   Problem: Health Behavior/Discharge Planning: Goal: Identification of resources available to assist in meeting health care needs will improve Outcome: Progressing Goal: Compliance with treatment plan for underlying cause of condition will improve Outcome: Progressing   Problem: Physical Regulation: Goal: Ability to maintain clinical measurements within normal limits will improve Outcome: Progressing   Problem: Safety: Goal: Periods of time without injury will increase Outcome: Progressing   Problem: Education: Goal: Ability to make informed decisions regarding treatment will improve Outcome: Progressing   Problem: Coping: Goal: Coping ability will improve Outcome: Progressing   Problem: Health Behavior/Discharge Planning: Goal: Identification of resources available to assist in meeting health care needs will improve Outcome: Progressing   Problem: Medication: Goal: Compliance with prescribed medication regimen will improve Outcome: Progressing   Problem: Self-Concept: Goal: Ability to disclose and discuss suicidal ideas will improve Outcome: Progressing Goal: Will verbalize positive feelings about self Outcome: Progressing   Problem: Education: Goal: Utilization of techniques to improve  thought processes will improve Outcome: Progressing Goal: Knowledge of the prescribed therapeutic regimen will improve Outcome: Progressing   Problem: Activity: Goal: Interest or engagement in leisure activities will improve Outcome: Progressing Goal: Imbalance in normal sleep/wake cycle will improve Outcome: Progressing   Problem: Coping: Goal: Coping ability will improve Outcome: Progressing Goal: Will verbalize feelings Outcome: Progressing   Problem: Health Behavior/Discharge Planning: Goal: Ability to make decisions will improve Outcome: Progressing Goal: Compliance with therapeutic regimen will improve Outcome: Progressing   Problem: Role Relationship: Goal: Will demonstrate positive changes in social behaviors and relationships Outcome: Progressing   Problem: Safety: Goal: Ability to disclose and discuss suicidal ideas will improve Outcome: Progressing Goal: Ability to identify and utilize support systems that promote safety will improve Outcome: Progressing   Problem: Self-Concept: Goal: Will verbalize positive feelings about self Outcome: Progressing Goal: Level of anxiety will decrease Outcome: Progressing   

## 2020-07-07 DIAGNOSIS — F314 Bipolar disorder, current episode depressed, severe, without psychotic features: Principal | ICD-10-CM

## 2020-07-07 MED ORDER — PRAZOSIN HCL 1 MG PO CAPS: 1.0000 mg | ORAL_CAPSULE | Freq: Every day | ORAL | 1 refills | Status: AC

## 2020-07-07 MED ORDER — TRAZODONE HCL 50 MG PO TABS: 50.0000 mg | ORAL_TABLET | Freq: Every evening | ORAL | 1 refills | Status: AC | PRN

## 2020-07-07 MED ORDER — ARIPIPRAZOLE 5 MG PO TABS: 5.0000 mg | ORAL_TABLET | Freq: Every day | ORAL | 1 refills | Status: AC
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
## 2020-07-07 NOTE — Plan of Care (Signed)
  Problem: Education: Goal: Knowledge of [place] [facility] Education information/materials will improve Outcome: Progressing Goal: Emotional status will improve Outcome: Progressing Goal: Mental status will improve Outcome: Progressing Goal: Verbalization of understanding the information provided will improve Outcome: Progressing   Problem: Activity: Goal: Interest or engagement in activities will improve Outcome: Progressing Goal: Sleeping patterns will improve Outcome: Progressing   Problem: Coping: Goal: Ability to verbalize frustrations and anger appropriately will improve Outcome: Progressing Goal: Ability to demonstrate self-control will improve Outcome: Progressing   Problem: Health Behavior/Discharge Planning: Goal: Identification of resources available to assist in meeting health care needs will improve Outcome: Progressing Goal: Compliance with treatment plan for underlying cause of condition will improve Outcome: Progressing   Problem: Physical Regulation: Goal: Ability to maintain clinical measurements within normal limits will improve Outcome: Progressing   Problem: Safety: Goal: Periods of time without injury will increase Outcome: Progressing   Problem: Education: Goal: Ability to make informed decisions regarding treatment will improve Outcome: Progressing   Problem: Coping: Goal: Coping ability will improve Outcome: Progressing   Problem: Health Behavior/Discharge Planning: Goal: Identification of resources available to assist in meeting health care needs will improve Outcome: Progressing   Problem: Medication: Goal: Compliance with prescribed medication regimen will improve Outcome: Progressing   Problem: Self-Concept: Goal: Ability to disclose and discuss suicidal ideas will improve Outcome: Progressing Goal: Will verbalize positive feelings about self Outcome: Progressing   Problem: Education: Goal: Utilization of techniques to improve  thought processes will improve Outcome: Progressing Goal: Knowledge of the prescribed therapeutic regimen will improve Outcome: Progressing   Problem: Activity: Goal: Interest or engagement in leisure activities will improve Outcome: Progressing Goal: Imbalance in normal sleep/wake cycle will improve Outcome: Progressing   Problem: Coping: Goal: Coping ability will improve Outcome: Progressing Goal: Will verbalize feelings Outcome: Progressing   Problem: Health Behavior/Discharge Planning: Goal: Ability to make decisions will improve Outcome: Progressing Goal: Compliance with therapeutic regimen will improve Outcome: Progressing   Problem: Role Relationship: Goal: Will demonstrate positive changes in social behaviors and relationships Outcome: Progressing   Problem: Safety: Goal: Ability to disclose and discuss suicidal ideas will improve Outcome: Progressing Goal: Ability to identify and utilize support systems that promote safety will improve Outcome: Progressing   Problem: Self-Concept: Goal: Will verbalize positive feelings about self Outcome: Progressing Goal: Level of anxiety will decrease Outcome: Progressing   

## 2020-07-07 NOTE — Progress Notes (Signed)
Pt was educated on prescriptions and follow up care. Pt questions were answered and pt verbalized understanding and did not voice any concerns. Pt's belongings were returned. Pt was not in distress at time of discharge. Pt was safely discharged to safe transport.

## 2020-07-07 NOTE — BHH Suicide Risk Assessment (Signed)
[facility] Discharge Suicide Risk Assessment   Principal Problem: Bipolar disorder, current episode depressed, severe, without psychotic features [facility]) Discharge Diagnoses: Principal Problem:   Bipolar disorder, current episode depressed, severe, without psychotic features (HCC) Active Problems:   Overdose   PTSD (post-traumatic stress disorder)   Total Time spent with patient: 35 minutes- 25 direct patient care, 10 minutes coordination of discharge, documentation, prescriptions  Musculoskeletal: Strength & Muscle Tone: within normal limits Gait & Station: normal Patient leans: N/A  Psychiatric Specialty Exam: Review of Systems  Constitutional: Negative for activity change and fatigue.  HENT: Negative for rhinorrhea and sore throat.   Eyes: Negative for photophobia and visual disturbance.  Respiratory: Negative for cough and shortness of breath.   Cardiovascular: Negative for chest pain and palpitations.  Gastrointestinal: Negative for constipation, diarrhea, nausea and vomiting.  Endocrine: Negative for cold intolerance and heat intolerance.  Genitourinary: Negative for difficulty urinating and dysuria.  Musculoskeletal: Negative for arthralgias and myalgias.  Skin: Negative for rash and wound.  Allergic/Immunologic: Negative for environmental allergies and food allergies.  Neurological: Negative for dizziness and headaches.  Hematological: Negative for adenopathy. Does not bruise/bleed easily.  Psychiatric/Behavioral: Negative for decreased concentration, dysphoric mood, hallucinations and suicidal ideas.    Blood pressure 118/89, pulse 74, temperature 97.8 F (36.6 C), temperature source Oral, resp. rate 18, height 5\' 8"  (1.727 m), weight 64.4 kg, SpO2 100 %.Body mass index is 21.59 kg/m.  General Appearance: Well Groomed  ::  Good  Speech:  Clear and Coherent and Normal Rate  Volume:  Normal  Mood:  Euthymic  Affect:  Congruent  Thought Process:  Coherent and  Linear  Orientation:  Full (Time, Place, and Person)  Thought Content:  Logical  Suicidal Thoughts:  No  Homicidal Thoughts:  No  Memory:  Immediate;   Fair Recent;   Fair Remote;   Fair  Judgement:  Intact  Insight:  Fair  Psychomotor Activity:  Normal  Concentration:  Fair  Recall:  [id number] of Knowledge:Fair  Language: Fair  Akathisia:  Negative  Handed:  Right  AIMS (if indicated):     Assets:  Communication Skills Desire for Improvement Housing Intimacy Physical Health Resilience Social Support  Sleep:  Number of Hours: 7.15  Cognition: WNL  ADL's:  Intact   Mental Status Per Nursing Assessment::   On Admission:  NA  Demographic Factors:  Male and Adolescent or young adult  Loss Factors: NA  Historical Factors: Prior suicide attempts  Risk Reduction Factors:   Sense of responsibility to family, Living with another person, especially a relative, Positive social support, Positive therapeutic relationship and Positive coping skills or problem solving skills  Continued Clinical Symptoms:  Bipolar Disorder:   Depressive phase Previous Psychiatric Diagnoses and Treatments  Cognitive Features That Contribute To Risk:  None    Suicide Risk:  Mild:  Suicidal ideation of limited frequency, intensity, duration, and specificity.  There are no identifiable plans, no associated intent, mild dysphoria and related symptoms, good self-control (both objective and subjective assessment), few other risk factors, and identifiable protective factors, including available and accessible social support.   Follow-up Information    [facility] Follow up.   Specialty: Urgent Care Why: No schedule appointments available.  Have to use walk-in appointments, for therapy: Friday 1pm-4pm, be there by 12:30pm; for medication management Mon-Thurs 8am-11am, please arrive by 7:45am.  Thanks! Contact information: [address] [place]  [place] [phone]  Plan Of Care/Follow-up recommendations:  Activity:  as tolerated Diet:  regular diet  Jesse Sans, MD 07/07/2020, 9:41 AM

## 2020-07-07 NOTE — Progress Notes (Signed)
  [facility] Adult Case Management Discharge Plan :  Will you be returning to the same living situation after discharge:  Yes,  pt reports that he is returning home.  At discharge, do you have transportation home?: Yes,  CSW will assist with transportation needs. Do you have the ability to pay for your medications: No.  Release of information consent forms completed and in the chart;  Patient's signature needed at discharge.  Patient to Follow up at:  Follow-up Information    [facility] Follow up.   Specialty: Urgent Care Why: No schedule appointments available.  Have to use walk-in appointments, for therapy: Friday 1pm-4pm, be there by 12:30pm; for medication management Mon-Thurs 8am-11am, please arrive by 7:45am.  Thanks! Contact information: [address] [place] [address] [phone]              Next level of care provider has access to [facility] Link:no  Safety Planning and Suicide Prevention discussed: Yes,  SPE completed with the patient.       Has patient been referred to the Quitline?: Patient refused referral  Patient has been referred for addiction treatment: Pt. refused referral  Harden Mo, LCSW 07/07/2020, 9:25 AM

## 2020-07-07 NOTE — Progress Notes (Signed)
Patient is alert and cooperative. Speech is clear, logical and coherent. He is easy to engage in conversation as he reports that he will be leaving the unit tomorrow.  He goes on to discuss his need to stay on his medication and presents with good insight as he discusses the need to stay med compliant and keep a healthy mind to help stay focused on the goals he has for himself. He is med compliant with his psychotropics but declined to take the stool softener stating that he did not need it. Patient tolerated his medication without incident. He was encouraged to come to staff with any concerns and remains safe on the unit with 15 minute safety checks.  Cleo [place]-N[place]olson. LPN

## 2020-11-24 IMAGING — CT CT HEAD W/O CM
4 series · 16 of 47 positions shown, 18 images · non-contrast
Comparison: No pertinent prior studies available for comparison.

CLINICAL DATA: Seizure, new, nontraumatic, 18-40 years.

EXAM:
CT HEAD WITHOUT CONTRAST
TECHNIQUE: Contiguous axial images were obtained from the base of the skull
through the vertex without intravenous contrast.

[Series 3: head without · axial · non-contrast · 0.45mm/px · z∈[-184,-54]mm · 7 of 36 slices shown, 9 images]
[im 5/36  brain]
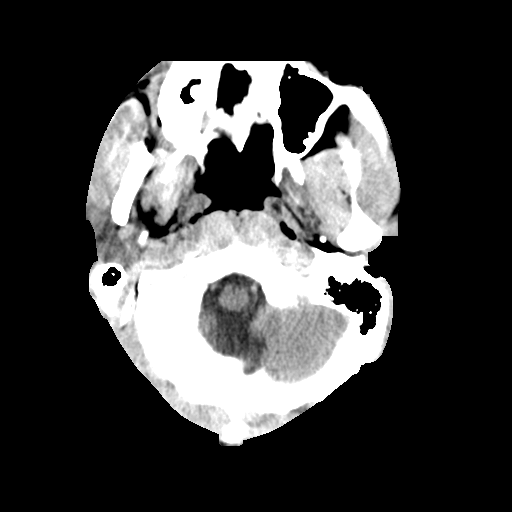
[im 5/36  bone]
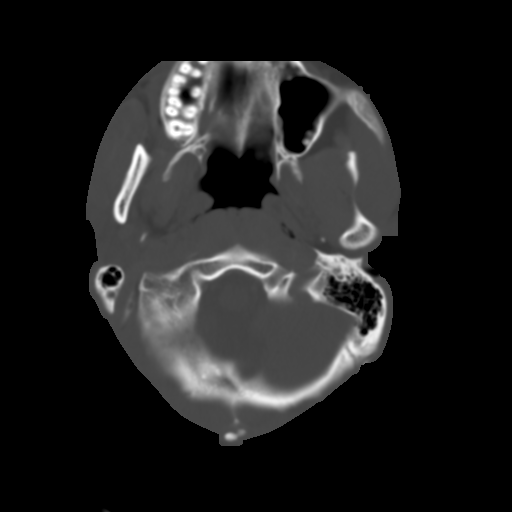
[im 9/36  brain]
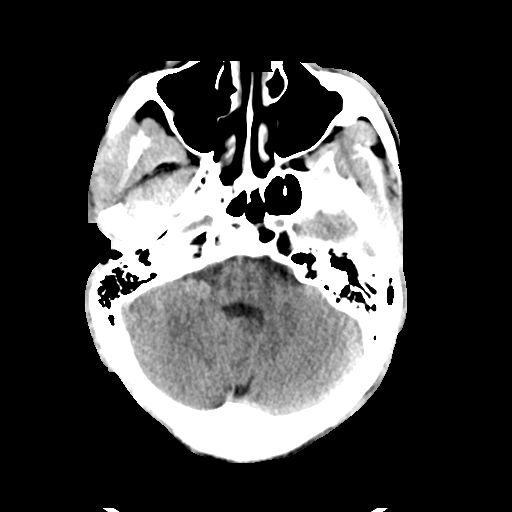
[im 14/36  brain]
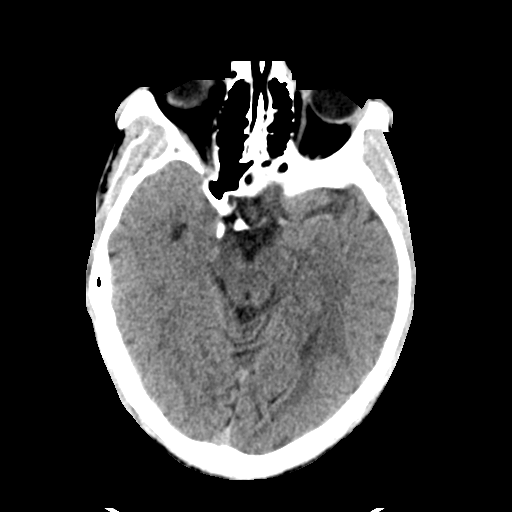
[im 18/36  brain]
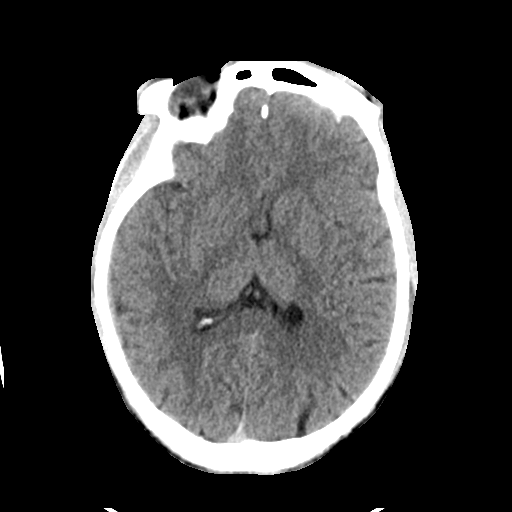
[im 22/36  brain]
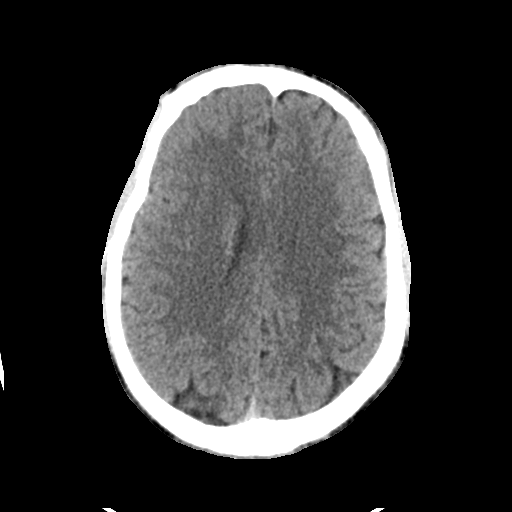
[im 22/36  bone]
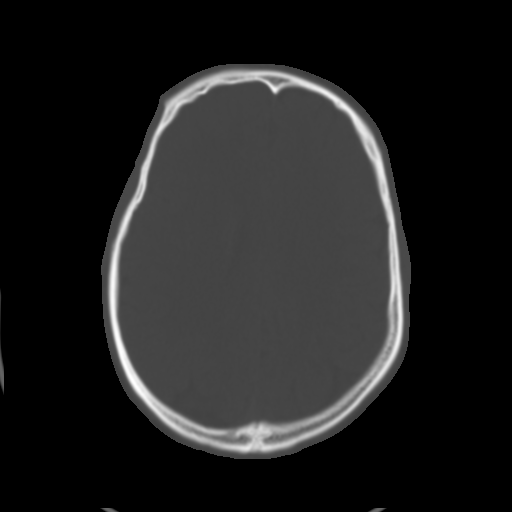
[im 27/36  brain]
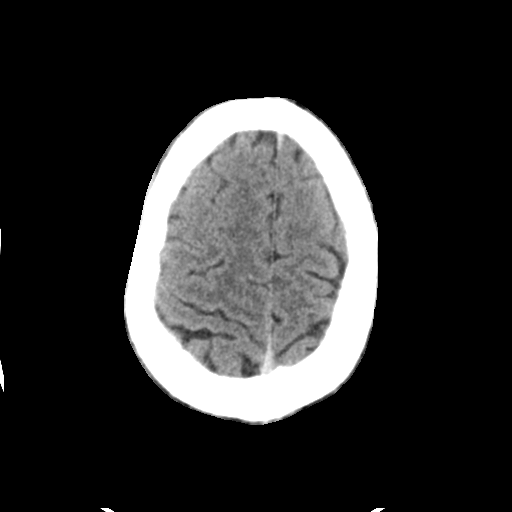
[im 31/36  brain]
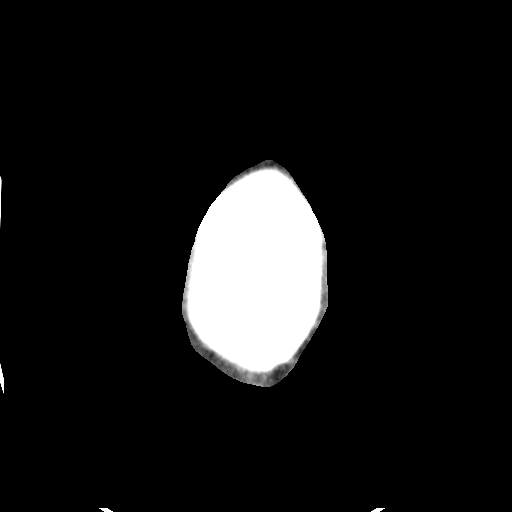

[Series 4: head bone · axial · 0.45mm/px · z∈[-188,-152]mm · 3 of 89 slices shown]
[im 9/89  bone]
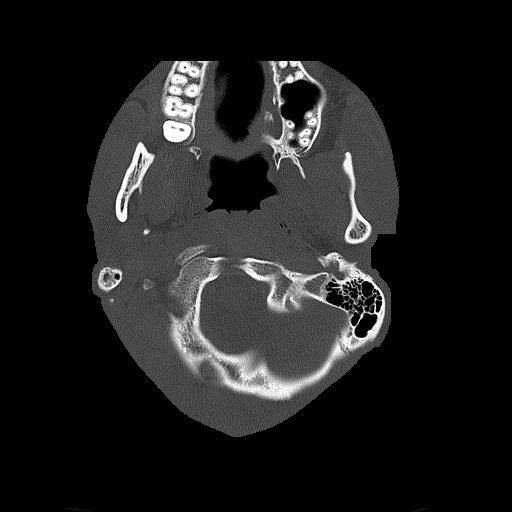
[im 18/89  bone]
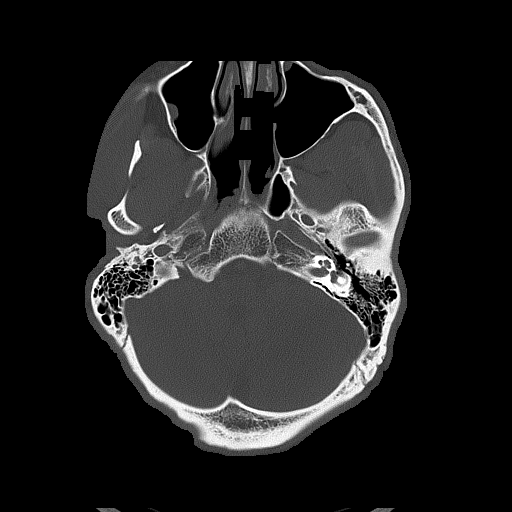
[im 27/89  bone]
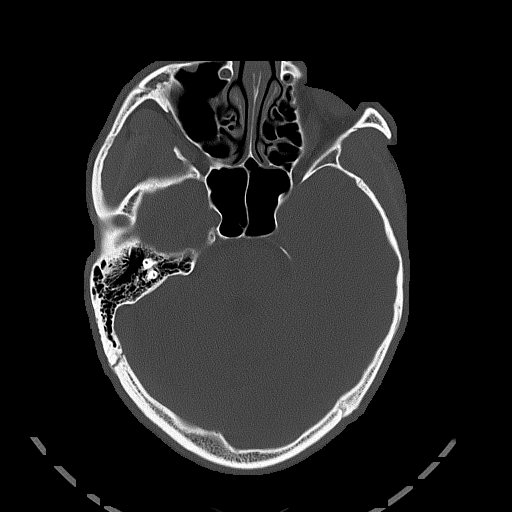

[Series 5: head without cor · coronal · non-contrast · 0.35mm/px · 3 of 76 slices shown]
[im 26/76  brain]
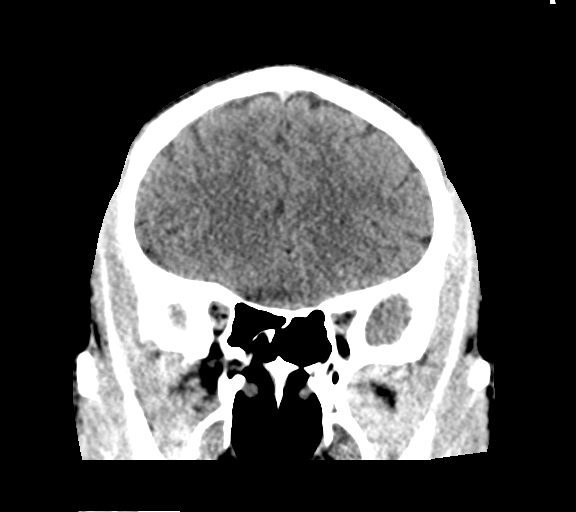
[im 34/76  brain]
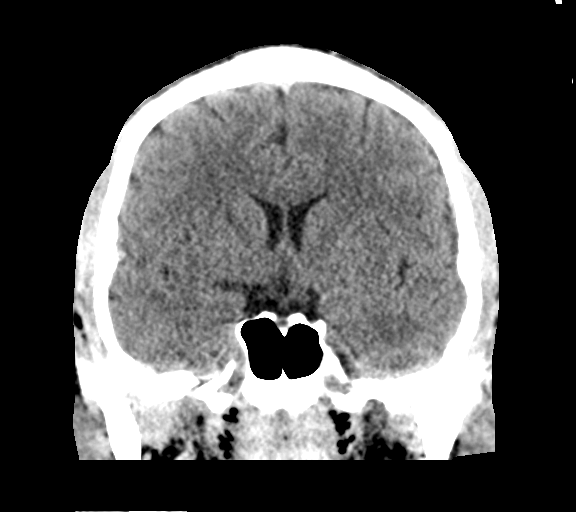
[im 42/76  brain]
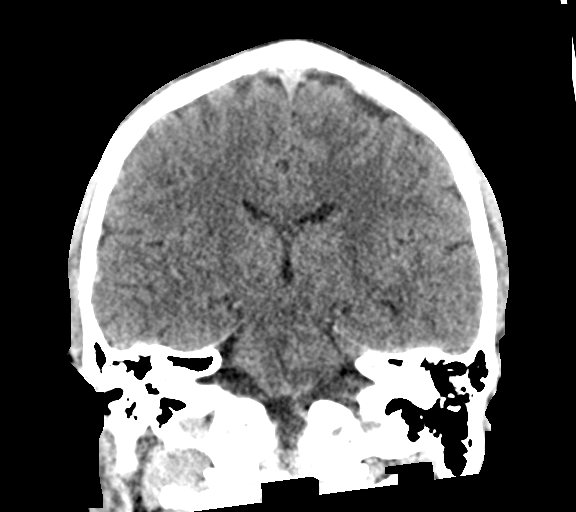

[Series 6: head without sag · sagittal · non-contrast · 0.35mm/px · 3 of 65 slices shown]
[im 24/65  brain]
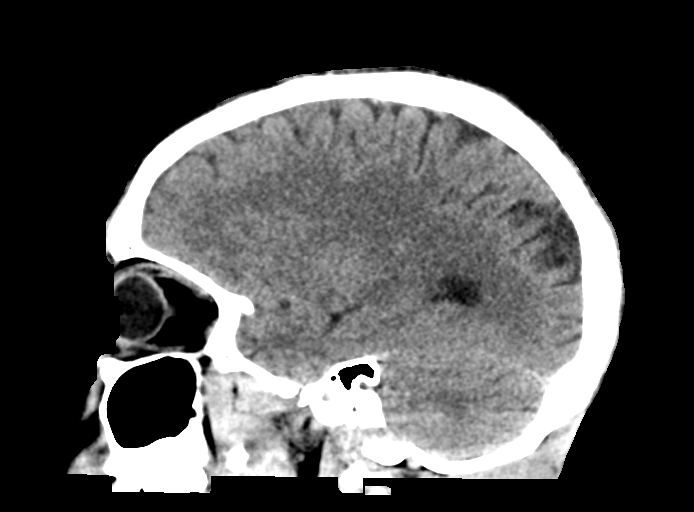
[im 33/65  brain]
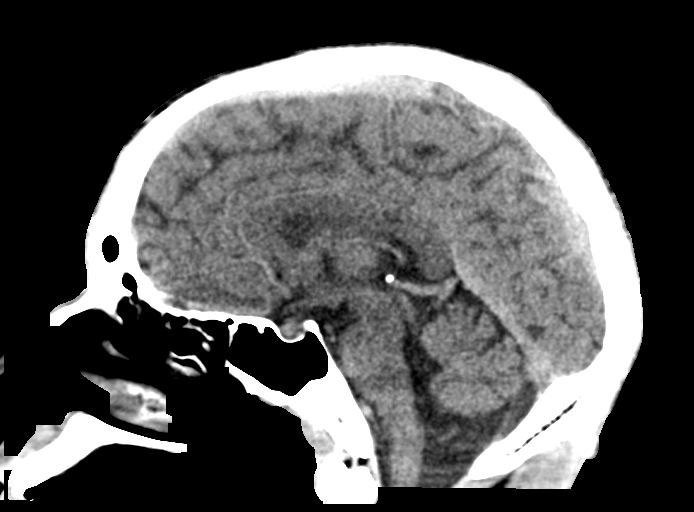
[im 42/65  brain]
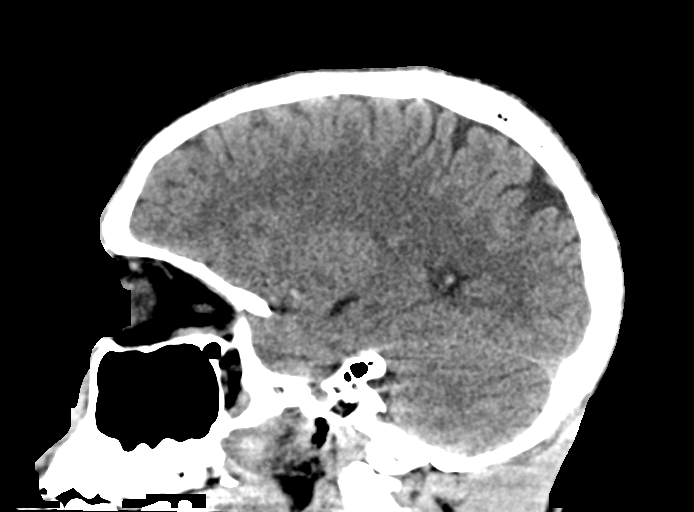

[16 of 47 positions shown; findings below may reference images not displayed]

FINDINGS: Brain:

No evidence of acute intracranial hemorrhage.

No demarcated cortical infarction.

No evidence of intracranial mass.

No midline shift or extra-axial fluid collection.

Cerebral volume is normal for age.

Vascular: No hyperdense vessel.

Skull: Normal. Negative for fracture or focal lesion.

Sinuses/Orbits: Visualized orbits demonstrate no acute abnormality.
Small mucous retention cysts within the bilateral maxillary sinuses.
IMPRESSION: No CT evidence of acute intracranial abnormality.

Given provided history of new seizure, consider brain MRI with
seizure protocol for further evaluation.
